# Patient Record
Sex: Female | Born: 1940 | Race: White | Hispanic: No | Smoking: Never smoker
Health system: Southern US, Community
[De-identification: ages and names within clinical notes are randomized; demographics above are authoritative.]

## PROBLEM LIST (undated history)

## (undated) ENCOUNTER — Ambulatory Visit: Admission: EM | Payer: Medicare Other

## (undated) DIAGNOSIS — H409 Unspecified glaucoma: Secondary | ICD-10-CM

## (undated) HISTORY — DX: Unspecified glaucoma: H40.9

---

## 1999-04-22 ENCOUNTER — Encounter: Payer: Self-pay | Admitting: Obstetrics and Gynecology

## 1999-04-22 ENCOUNTER — Encounter: Admission: RE | Admit: 1999-04-22 | Discharge: 1999-04-22 | Payer: Self-pay | Admitting: Obstetrics and Gynecology

## 1999-09-22 ENCOUNTER — Encounter: Payer: Self-pay | Admitting: Obstetrics and Gynecology

## 1999-09-22 ENCOUNTER — Encounter: Admission: RE | Admit: 1999-09-22 | Discharge: 1999-09-22 | Payer: Self-pay | Admitting: Obstetrics and Gynecology

## 2000-10-12 ENCOUNTER — Encounter: Admission: RE | Admit: 2000-10-12 | Discharge: 2000-10-12 | Payer: Self-pay | Admitting: Obstetrics and Gynecology

## 2000-10-12 ENCOUNTER — Encounter: Payer: Self-pay | Admitting: Obstetrics and Gynecology

## 2001-03-08 ENCOUNTER — Encounter: Admission: RE | Admit: 2001-03-08 | Discharge: 2001-03-08 | Payer: Self-pay | Admitting: Endocrinology

## 2001-03-08 ENCOUNTER — Encounter: Payer: Self-pay | Admitting: Endocrinology

## 2001-10-29 ENCOUNTER — Encounter: Admission: RE | Admit: 2001-10-29 | Discharge: 2001-10-29 | Payer: Self-pay | Admitting: Obstetrics and Gynecology

## 2001-10-29 ENCOUNTER — Encounter: Payer: Self-pay | Admitting: Obstetrics and Gynecology

## 2002-01-15 ENCOUNTER — Encounter: Admission: RE | Admit: 2002-01-15 | Discharge: 2002-01-15 | Payer: Self-pay | Admitting: Orthopedic Surgery

## 2002-01-15 ENCOUNTER — Encounter: Payer: Self-pay | Admitting: Orthopedic Surgery

## 2002-11-07 ENCOUNTER — Encounter: Admission: RE | Admit: 2002-11-07 | Discharge: 2002-11-07 | Payer: Self-pay | Admitting: Obstetrics and Gynecology

## 2002-11-07 ENCOUNTER — Encounter: Payer: Self-pay | Admitting: Obstetrics and Gynecology

## 2003-12-01 ENCOUNTER — Encounter: Admission: RE | Admit: 2003-12-01 | Discharge: 2003-12-01 | Payer: Self-pay | Admitting: Obstetrics and Gynecology

## 2005-03-03 ENCOUNTER — Encounter: Admission: RE | Admit: 2005-03-03 | Discharge: 2005-03-03 | Payer: Self-pay | Admitting: Endocrinology

## 2006-03-21 ENCOUNTER — Encounter: Admission: RE | Admit: 2006-03-21 | Discharge: 2006-03-21 | Payer: Self-pay | Admitting: Endocrinology

## 2006-05-07 ENCOUNTER — Ambulatory Visit (HOSPITAL_COMMUNITY): Admission: RE | Admit: 2006-05-07 | Discharge: 2006-05-07 | Payer: Self-pay | Admitting: Endocrinology

## 2006-05-07 ENCOUNTER — Ambulatory Visit: Payer: Self-pay | Admitting: *Deleted

## 2007-04-05 ENCOUNTER — Encounter: Admission: RE | Admit: 2007-04-05 | Discharge: 2007-04-05 | Payer: Self-pay | Admitting: Endocrinology

## 2007-04-14 ENCOUNTER — Observation Stay (HOSPITAL_COMMUNITY): Admission: EM | Admit: 2007-04-14 | Discharge: 2007-04-14 | Payer: Self-pay | Admitting: *Deleted

## 2008-04-06 ENCOUNTER — Encounter: Admission: RE | Admit: 2008-04-06 | Discharge: 2008-04-06 | Payer: Self-pay | Admitting: Obstetrics and Gynecology

## 2009-04-22 ENCOUNTER — Encounter: Admission: RE | Admit: 2009-04-22 | Discharge: 2009-04-22 | Payer: Self-pay | Admitting: Obstetrics and Gynecology

## 2010-04-25 ENCOUNTER — Encounter
Admission: RE | Admit: 2010-04-25 | Discharge: 2010-04-25 | Payer: Self-pay | Source: Home / Self Care | Attending: Obstetrics and Gynecology | Admitting: Obstetrics and Gynecology

## 2010-12-16 LAB — BASIC METABOLIC PANEL
BUN: 11
Calcium: 9.3
GFR calc Af Amer: >60
GFR calc non Af Amer: >60
Glucose, Bld: 137 — ABNORMAL HIGH
Potassium: 4.3
Sodium: 140

## 2010-12-16 LAB — HEMOGLOBIN AND HEMATOCRIT, BLOOD
HCT: 36.9
Hemoglobin: 12.7

## 2011-04-04 ENCOUNTER — Other Ambulatory Visit: Payer: Self-pay | Admitting: Internal Medicine

## 2011-04-04 DIAGNOSIS — Z1231 Encounter for screening mammogram for malignant neoplasm of breast: Secondary | ICD-10-CM

## 2011-05-05 ENCOUNTER — Ambulatory Visit
Admission: RE | Admit: 2011-05-05 | Discharge: 2011-05-05 | Disposition: A | Discharge status: Home / Self Care | Payer: Medicare Other | Source: Ambulatory Visit | Attending: Internal Medicine | Admitting: Internal Medicine

## 2011-05-05 DIAGNOSIS — Z1231 Encounter for screening mammogram for malignant neoplasm of breast: Secondary | ICD-10-CM

## 2012-05-07 ENCOUNTER — Other Ambulatory Visit: Payer: Self-pay | Admitting: Internal Medicine

## 2012-05-07 DIAGNOSIS — Z1231 Encounter for screening mammogram for malignant neoplasm of breast: Secondary | ICD-10-CM

## 2012-05-28 ENCOUNTER — Ambulatory Visit
Admission: RE | Admit: 2012-05-28 | Discharge: 2012-05-28 | Disposition: A | Discharge status: Home / Self Care | Payer: Medicare Other | Source: Ambulatory Visit | Attending: Internal Medicine | Admitting: Internal Medicine

## 2012-05-28 DIAGNOSIS — Z1231 Encounter for screening mammogram for malignant neoplasm of breast: Secondary | ICD-10-CM

## 2013-05-30 ENCOUNTER — Other Ambulatory Visit: Payer: Self-pay

## 2013-05-30 DIAGNOSIS — Z1231 Encounter for screening mammogram for malignant neoplasm of breast: Secondary | ICD-10-CM

## 2013-06-17 ENCOUNTER — Ambulatory Visit
Admission: RE | Admit: 2013-06-17 | Discharge: 2013-06-17 | Disposition: A | Discharge status: Home / Self Care | Payer: Medicare Other | Source: Ambulatory Visit

## 2013-06-17 DIAGNOSIS — Z1231 Encounter for screening mammogram for malignant neoplasm of breast: Secondary | ICD-10-CM

## 2014-06-16 ENCOUNTER — Other Ambulatory Visit: Payer: Self-pay

## 2014-06-16 DIAGNOSIS — Z1231 Encounter for screening mammogram for malignant neoplasm of breast: Secondary | ICD-10-CM

## 2014-07-14 ENCOUNTER — Ambulatory Visit
Admission: RE | Admit: 2014-07-14 | Discharge: 2014-07-14 | Disposition: A | Discharge status: Home / Self Care | Payer: Medicare Other | Source: Ambulatory Visit

## 2014-07-14 DIAGNOSIS — Z1231 Encounter for screening mammogram for malignant neoplasm of breast: Secondary | ICD-10-CM

## 2015-06-22 ENCOUNTER — Other Ambulatory Visit: Payer: Self-pay

## 2015-06-22 DIAGNOSIS — Z1231 Encounter for screening mammogram for malignant neoplasm of breast: Secondary | ICD-10-CM

## 2015-07-13 ENCOUNTER — Ambulatory Visit: Payer: Medicare Other

## 2015-07-15 ENCOUNTER — Ambulatory Visit: Payer: Medicare Other

## 2015-07-20 ENCOUNTER — Ambulatory Visit
Admission: RE | Admit: 2015-07-20 | Discharge: 2015-07-20 | Disposition: A | Discharge status: Home / Self Care | Payer: Medicare Other | Source: Ambulatory Visit

## 2015-07-20 DIAGNOSIS — Z1231 Encounter for screening mammogram for malignant neoplasm of breast: Secondary | ICD-10-CM

## 2016-06-22 ENCOUNTER — Other Ambulatory Visit: Payer: Self-pay | Admitting: Internal Medicine

## 2016-06-22 DIAGNOSIS — Z1231 Encounter for screening mammogram for malignant neoplasm of breast: Secondary | ICD-10-CM

## 2016-08-14 ENCOUNTER — Other Ambulatory Visit: Payer: Self-pay | Admitting: Internal Medicine

## 2016-08-14 ENCOUNTER — Ambulatory Visit
Admission: RE | Admit: 2016-08-14 | Discharge: 2016-08-14 | Disposition: A | Discharge status: Home / Self Care | Payer: Medicare Other | Source: Ambulatory Visit | Attending: Internal Medicine | Admitting: Internal Medicine

## 2016-08-14 DIAGNOSIS — Z1231 Encounter for screening mammogram for malignant neoplasm of breast: Secondary | ICD-10-CM

## 2016-08-25 ENCOUNTER — Other Ambulatory Visit: Payer: Self-pay | Admitting: Internal Medicine

## 2017-07-23 ENCOUNTER — Other Ambulatory Visit: Payer: Self-pay | Admitting: Internal Medicine

## 2017-07-23 DIAGNOSIS — Z1231 Encounter for screening mammogram for malignant neoplasm of breast: Secondary | ICD-10-CM

## 2017-07-27 ENCOUNTER — Other Ambulatory Visit: Payer: Self-pay | Admitting: Internal Medicine

## 2017-07-27 DIAGNOSIS — M5416 Radiculopathy, lumbar region: Secondary | ICD-10-CM

## 2017-08-10 ENCOUNTER — Ambulatory Visit
Admission: RE | Admit: 2017-08-10 | Discharge: 2017-08-10 | Disposition: A | Discharge status: Home / Self Care | Payer: Medicare Other | Source: Ambulatory Visit | Attending: Internal Medicine | Admitting: Internal Medicine

## 2017-08-10 DIAGNOSIS — M5416 Radiculopathy, lumbar region: Secondary | ICD-10-CM

## 2017-08-31 ENCOUNTER — Ambulatory Visit
Admission: RE | Admit: 2017-08-31 | Discharge: 2017-08-31 | Disposition: A | Discharge status: Home / Self Care | Payer: Medicare Other | Source: Ambulatory Visit | Attending: Internal Medicine | Admitting: Internal Medicine

## 2017-08-31 DIAGNOSIS — Z1231 Encounter for screening mammogram for malignant neoplasm of breast: Secondary | ICD-10-CM

## 2018-08-02 ENCOUNTER — Other Ambulatory Visit: Payer: Self-pay | Admitting: Internal Medicine

## 2018-08-02 DIAGNOSIS — Z1231 Encounter for screening mammogram for malignant neoplasm of breast: Secondary | ICD-10-CM

## 2018-10-15 ENCOUNTER — Other Ambulatory Visit: Payer: Self-pay

## 2018-10-15 ENCOUNTER — Ambulatory Visit
Admission: RE | Admit: 2018-10-15 | Discharge: 2018-10-15 | Disposition: A | Discharge status: Home / Self Care | Payer: Medicare Other | Source: Ambulatory Visit | Attending: Internal Medicine | Admitting: Internal Medicine

## 2018-10-15 DIAGNOSIS — Z1231 Encounter for screening mammogram for malignant neoplasm of breast: Secondary | ICD-10-CM

## 2019-02-25 DIAGNOSIS — H547 Unspecified visual loss: Secondary | ICD-10-CM

## 2019-02-25 HISTORY — DX: Unspecified visual loss: H54.7

## 2019-08-24 LAB — COLOGUARD: COLOGUARD: NEGATIVE

## 2019-09-12 ENCOUNTER — Other Ambulatory Visit: Payer: Self-pay | Admitting: Internal Medicine

## 2019-09-12 DIAGNOSIS — Z1231 Encounter for screening mammogram for malignant neoplasm of breast: Secondary | ICD-10-CM

## 2019-10-29 ENCOUNTER — Ambulatory Visit
Admission: RE | Admit: 2019-10-29 | Discharge: 2019-10-29 | Disposition: A | Discharge status: Home / Self Care | Payer: Medicare Other | Source: Ambulatory Visit | Attending: Internal Medicine | Admitting: Internal Medicine

## 2019-10-29 ENCOUNTER — Other Ambulatory Visit: Payer: Self-pay

## 2019-10-29 DIAGNOSIS — Z1231 Encounter for screening mammogram for malignant neoplasm of breast: Secondary | ICD-10-CM

## 2020-09-16 ENCOUNTER — Other Ambulatory Visit: Payer: Self-pay | Admitting: Internal Medicine

## 2020-09-16 DIAGNOSIS — N6320 Unspecified lump in the left breast, unspecified quadrant: Secondary | ICD-10-CM

## 2021-01-31 ENCOUNTER — Ambulatory Visit
Admission: RE | Admit: 2021-01-31 | Discharge: 2021-01-31 | Disposition: A | Discharge status: Home / Self Care | Payer: Medicare Other | Source: Ambulatory Visit | Attending: Internal Medicine | Admitting: Internal Medicine

## 2021-01-31 ENCOUNTER — Ambulatory Visit: Payer: Medicare Other

## 2021-01-31 ENCOUNTER — Other Ambulatory Visit: Payer: Self-pay

## 2021-01-31 DIAGNOSIS — N6320 Unspecified lump in the left breast, unspecified quadrant: Secondary | ICD-10-CM

## 2021-02-11 ENCOUNTER — Ambulatory Visit
Admission: RE | Admit: 2021-02-11 | Discharge: 2021-02-11 | Disposition: A | Discharge status: Home / Self Care | Payer: Medicare Other | Source: Ambulatory Visit | Attending: Internal Medicine | Admitting: Internal Medicine

## 2021-02-11 ENCOUNTER — Other Ambulatory Visit: Payer: Self-pay

## 2021-02-11 DIAGNOSIS — N6320 Unspecified lump in the left breast, unspecified quadrant: Secondary | ICD-10-CM

## 2021-03-30 ENCOUNTER — Other Ambulatory Visit: Payer: Self-pay | Admitting: Internal Medicine

## 2021-03-30 DIAGNOSIS — R928 Other abnormal and inconclusive findings on diagnostic imaging of breast: Secondary | ICD-10-CM

## 2021-03-30 DIAGNOSIS — N649 Disorder of breast, unspecified: Secondary | ICD-10-CM

## 2021-03-30 DIAGNOSIS — R922 Inconclusive mammogram: Secondary | ICD-10-CM

## 2021-04-06 ENCOUNTER — Other Ambulatory Visit: Payer: Medicare Other

## 2021-04-07 ENCOUNTER — Other Ambulatory Visit: Payer: Self-pay

## 2021-04-07 ENCOUNTER — Encounter: Payer: Self-pay | Admitting: Dermatology

## 2021-04-07 ENCOUNTER — Ambulatory Visit (INDEPENDENT_AMBULATORY_CARE_PROVIDER_SITE_OTHER): Payer: Medicare Other | Admitting: Dermatology

## 2021-04-07 DIAGNOSIS — L858 Other specified epidermal thickening: Secondary | ICD-10-CM | POA: Diagnosis not present

## 2021-04-14 ENCOUNTER — Other Ambulatory Visit: Payer: Medicare Other

## 2021-04-21 ENCOUNTER — Ambulatory Visit: Payer: Medicare Other | Admitting: Dermatology

## 2021-04-28 ENCOUNTER — Ambulatory Visit (INDEPENDENT_AMBULATORY_CARE_PROVIDER_SITE_OTHER): Payer: Medicare Other | Admitting: Dermatology

## 2021-04-28 ENCOUNTER — Encounter: Payer: Self-pay | Admitting: Dermatology

## 2021-04-28 ENCOUNTER — Other Ambulatory Visit: Payer: Self-pay

## 2021-04-28 DIAGNOSIS — B359 Dermatophytosis, unspecified: Secondary | ICD-10-CM

## 2021-04-28 DIAGNOSIS — L821 Other seborrheic keratosis: Secondary | ICD-10-CM | POA: Diagnosis not present

## 2021-04-28 DIAGNOSIS — C44719 Basal cell carcinoma of skin of left lower limb, including hip: Secondary | ICD-10-CM

## 2021-04-28 DIAGNOSIS — C44722 Squamous cell carcinoma of skin of right lower limb, including hip: Secondary | ICD-10-CM | POA: Diagnosis not present

## 2021-04-28 DIAGNOSIS — D485 Neoplasm of uncertain behavior of skin: Secondary | ICD-10-CM

## 2021-04-28 DIAGNOSIS — R21 Rash and other nonspecific skin eruption: Secondary | ICD-10-CM

## 2021-04-28 LAB — POCT SKIN KOH

## 2021-04-28 MED ORDER — MUPIROCIN 2 % EX OINT: TOPICAL_OINTMENT | CUTANEOUS | 2 refills | Status: AC

## 2021-04-28 NOTE — Patient Instructions (Signed)

## 2021-04-30 ENCOUNTER — Encounter: Payer: Self-pay | Admitting: Dermatology

## 2021-04-30 NOTE — Progress Notes (Signed)
° °  Follow-Up Visit   Subjective  April Klein is a 81 y.o. female who presents for the following: Skin Problem (Pt got scratched by her dog on the R lower leg about 8wks ago and she was self treating. Soon after she started 10 days of doxy (mg unknown) and using mupiricin oint. Debridement was done at [place] and she was told to have it evaluated for skin cancer /Pt also has a scab on the Left Posterior Leg that has been present for about 6 months. Scabs up a lot a lot never heals ).  Nonhealing spots on legs Location:  Duration:  Quality:  Associated Signs/Symptoms: Modifying Factors:  Severity:  Timing: Context:   Objective  Well appearing patient in no apparent distress; mood and affect are within normal limits. Left Lower Leg - Posterior, Right Lower Leg - Anterior Although the right shin lesion may have been triggered by minor trauma, this heaped up volcano shaped nodule best fits a squamous cell carcinoma, perhaps keratoacanthoma variant.  The left calf lesion may be more superficial squamous cell carcinoma.             A focused examination was performed including arms, legs. Relevant physical exam findings are noted in the Assessment and Plan.   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  Will return after her trip to [place].      I, Lavonna Monarch, MD, have reviewed all documentation for this visit.  The documentation on 04/30/21 for the exam, diagnosis, procedures, and orders are all accurate and complete.

## 2021-05-03 ENCOUNTER — Telehealth: Payer: Self-pay | Admitting: Dermatology

## 2021-05-03 NOTE — Telephone Encounter (Signed)
Path to patient and mohs surgery information sent. Patient is aware of the 3 week time frame for the call back for mohs.

## 2021-05-03 NOTE — Telephone Encounter (Signed)
Patient calling for results of Bx's. I informed patient that we are waiting for provider to review results.

## 2021-05-12 ENCOUNTER — Other Ambulatory Visit: Payer: Medicare Other

## 2021-05-21 ENCOUNTER — Encounter: Payer: Self-pay | Admitting: Dermatology

## 2021-05-21 NOTE — Progress Notes (Signed)
Left  Follow-Up Visit   Subjective  April Klein is a 81 y.o. female who presents for the following: Follow-up (Patient here today for biopsies on right lower leg and left shin. Per patient she has a lesion on her right hip x 2 years no bleeding, no pain per patient it looks like the lesion on her right lower leg. Per patient she also has a scaly lesion on her right areola x few weeks no treatment. ).  Returns for biopsy of her legs but also has growing spot on right lower leg and would like lesions on hip and breast check. Location:  Duration:  Quality:  Associated Signs/Symptoms: Modifying Factors:  Severity:  Timing: Context:   Objective  Well appearing patient in no apparent distress; mood and affect are within normal limits. Right Hip Textured brown flattopped papules also has on right areola.  Typical dermoscopy  Right Lower Leg - Anterior Pearly 8 mm crust suspicious for BCC       Left Lower Leg - Anterior 1.2 cm volcano like crust, keratoacanthoma type SCCA.  This will almost surely best be treated with Mohs surgery.       Right Breast KOH - POSITIVE    A focused examination was performed including legs, hip, right breast.. Relevant physical exam findings are noted in the Assessment and Plan.   Assessment & Plan    Seborrheic keratosis Right Hip  No intervention currently necessary  Neoplasm of uncertain behavior of skin (2) Right Lower Leg - Anterior  Skin / nail biopsy Type of biopsy: tangential   Informed consent: discussed and consent obtained   Timeout: patient name, date of birth, surgical site, and procedure verified   Anesthesia: the lesion was anesthetized in a standard fashion   Anesthetic:  1% lidocaine w/ epinephrine 1-100,000 local infiltration Instrument used: flexible razor blade   Hemostasis achieved with: ferric subsulfate and electrodesiccation   Outcome: patient tolerated procedure well   Post-procedure details: wound care  instructions given    Specimen 1 - Surgical pathology Differential Diagnosis: R/O BCC VS SCC  Check Margins: No  Left Lower Leg - Anterior  Skin / nail biopsy Type of biopsy: tangential   Informed consent: discussed and consent obtained   Timeout: patient name, date of birth, surgical site, and procedure verified   Anesthesia: the lesion was anesthetized in a standard fashion   Anesthetic:  1% lidocaine w/ epinephrine 1-100,000 local infiltration Instrument used: flexible razor blade   Hemostasis achieved with: ferric subsulfate and electrodesiccation   Outcome: patient tolerated procedure well   Post-procedure details: wound care instructions given    Specimen 2 - Surgical pathology Differential Diagnosis: R/O BCC VS SCC   Check Margins: No  Rash and other nonspecific skin eruption Right Breast  Related Procedures POCT Skin KOH Culture, fungus without smear  Related Medications mupirocin ointment (BACTROBAN) 2 % APPLY TO AFFECTED AREA 3 TIMES A DAY  Tinea  Related Procedures Culture, fungus without smear      I, Lavonna Monarch, MD, have reviewed all documentation for this visit.  The documentation on 05/21/21 for the exam, diagnosis, procedures, and orders are all accurate and complete.

## 2021-05-28 LAB — CULTURE, FUNGUS WITHOUT SMEAR
MICRO NUMBER:: 12954939
SPECIMEN QUALITY:: ADEQUATE

## 2022-05-17 ENCOUNTER — Other Ambulatory Visit: Payer: Self-pay | Admitting: Radiology

## 2022-05-19 ENCOUNTER — Encounter: Payer: Self-pay | Admitting: *Deleted

## 2022-05-23 ENCOUNTER — Other Ambulatory Visit: Payer: Self-pay

## 2022-05-23 ENCOUNTER — Telehealth: Payer: Self-pay | Admitting: Hematology and Oncology

## 2022-05-23 ENCOUNTER — Inpatient Hospital Stay: Payer: Medicare Other | Attending: Hematology and Oncology | Admitting: Hematology and Oncology

## 2022-05-23 VITALS — BP 171/80 | HR 60 | Temp 97.6°F | Resp 18 | Wt 201.8 lb

## 2022-05-23 DIAGNOSIS — Z170 Estrogen receptor positive status [ER+]: Secondary | ICD-10-CM | POA: Diagnosis not present

## 2022-05-23 DIAGNOSIS — C50412 Malignant neoplasm of upper-outer quadrant of left female breast: Secondary | ICD-10-CM

## 2022-05-23 MED ORDER — ANASTROZOLE 1 MG PO TABS: 1.0000 mg | ORAL_TABLET | Freq: Every day | ORAL | 3 refills | Status: DC

## 2022-05-23 NOTE — Telephone Encounter (Signed)
Scheduled appt per 2/27 staff msg. Pt is aware of appt date  and time. Pt is aware to arrive 15 mins prior to appt time and to bring and updated insurance card. Pt is aware of appt location.

## 2022-05-23 NOTE — Assessment & Plan Note (Addendum)
05/17/2022:Mammogram and ultrasound detected left breast cancer 8 cm size biopsy revealed grade 2 ILC with LCIS, lymph node positive for cancer, ER 100%, PR 100%, Ki67 20%, HER2 1+  Pathology and radiology counseling:Discussed with the patient, the details of pathology including the type of breast cancer,the clinical staging, the significance of ER, PR and HER-2/neu receptors and the implications for treatment. After reviewing the pathology in detail, we proceeded to discuss the different treatment options between surgery, radiation, chemotherapy, antiestrogen therapies.  Recommendations: 1. Breast conserving surgery followed by 2. Adjuvant radiation therapy followed by 3. Adjuvant antiestrogen therapy  Given the lobular nature of her breast cancer and her age we decided that chemotherapy would not be in her best interest.  She is in complete agreement and wants [place] to focus on her quality of life.   Return to clinic after surgery to discuss final pathology report

## 2022-05-23 NOTE — Progress Notes (Signed)
[facility] CONSULT NOTE  Patient Care Team: Prince Solian, MD as PCP - General (Internal Medicine)  CHIEF COMPLAINTS/PURPOSE OF CONSULTATION:  Newly diagnosed breast cancer  HISTORY OF PRESENTING ILLNESS:  April Klein 82 y.o. female is here because of recent diagnosis of left breast cancer.  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  Most recent mammogram suggested an 8 cm area of abnormality that was concerning for breast cancer and so she underwent a biopsy.  There was also a left axillary lymph node that was biopsied positive for breast cancer.  She wanted to see [place] for initial consultation and then see Dr. Donne Hazel.  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  I reviewed her records extensively and collaborated the history with the patient.  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  SOCIAL HISTORY: Social History   Socioeconomic History   Marital status: Widowed    Spouse name: Not on file   Number of children: Not on file   Years of education: Not on file   Highest education level: Not on file  Occupational History   Not on file   Tobacco Use   Smoking status: Never   Smokeless tobacco: Never  Substance and Sexual Activity   Alcohol use: Not on file   Drug use: Not on file   Sexual activity: Not on file  Other Topics Concern   Not on file  Social History Narrative   Not on file   Social Determinants of Health   Financial Resource Strain: Not on file  Food Insecurity: Not on file  Transportation Needs: Not on file  Physical Activity: Not on file  Stress: Not on file  Social Connections: Not on file  Intimate Partner Violence: Not on file    FAMILY HISTORY: No family history on file.  ALLERGIES:  is allergic to cortisone, povidone-iodine, and statins.  MEDICATIONS:  Current Outpatient Medications  Medication Sig Dispense Refill   anastrozole (ARIMIDEX) 1 MG tablet Take 1 tablet (1 mg total) by mouth daily. 90 tablet 3   B Complex Vitamins (VITAMIN B COMPLEX PO) Take 1 capsule by mouth daily.     Cholecalciferol 50 MCG (2000 UT) CAPS Take by mouth.     Cranberry 400 MG CAPS cranberry 400 mg capsule  Take 1 capsule every day by oral route.     ezetimibe (ZETIA) 10 MG tablet Take 10 mg by mouth daily.     levothyroxine (SYNTHROID) 50 MCG tablet Take 50 mcg by mouth every morning.     Lutein 20 MG TABS lutein 20 mg tablet  Take 1 tablet every day by oral route.  meloxicam (MOBIC) 15 MG tablet meloxicam 15 mg tablet     meloxicam (MOBIC) 7.5 MG tablet meloxicam 7.5 mg tablet     Multiple Vitamins-Minerals (PRESERVISION AREDS 2+MULTI VIT PO) PreserVision AREDS  take 2 tabs daily     mupirocin ointment (BACTROBAN) 2 % APPLY TO AFFECTED AREA 3 TIMES A DAY 22 g 2   No current facility-administered medications for this visit.    REVIEW OF SYSTEMS:   Constitutional: Denies fevers, chills or abnormal night sweats Breast:  Denies any palpable lumps or discharge All other systems were reviewed with the patient and are negative.  PHYSICAL EXAMINATION: ECOG PERFORMANCE STATUS: 1 - Symptomatic but  completely ambulatory  Vitals:   05/23/22 1533  BP: (!) 171/80  Pulse: 60  Resp: 18  Temp: 97.6 F (36.4 C)  SpO2: 98%   Filed Weights   05/23/22 1533  Weight: 201 lb 12.8 oz (91.5 kg)    GENERAL:alert, no distress and comfortable    LABORATORY DATA:  I have reviewed the data as listed Lab Results  Component Value Date   HGB 12.7 04/14/2007   HCT 36.9 04/14/2007   Lab Results  Component Value Date   NA 140 04/14/2007   K 4.3 04/14/2007   CL 105 04/14/2007   CO2 24 04/14/2007    RADIOGRAPHIC STUDIES: I have personally reviewed the radiological reports and agreed with the findings in the report.  ASSESSMENT AND PLAN:  Malignant neoplasm of upper-outer quadrant of left breast in female, estrogen receptor positive ([facility]) 05/17/2022:Mammogram and ultrasound detected left breast cancer 8 cm size biopsy revealed grade 2 ILC with LCIS, lymph node positive for cancer, ER 100%, PR 100%, Ki67 20%, HER2 1+  Pathology and radiology counseling:Discussed with the patient, the details of pathology including the type of breast cancer,the clinical staging, the significance of ER, PR and HER-2/neu receptors and the implications for treatment. After reviewing the pathology in detail, we proceeded to discuss the different treatment options between surgery, radiation, chemotherapy, antiestrogen therapies.  Recommendations: 1. Breast conserving surgery followed by 2. Adjuvant radiation therapy followed by 3. Adjuvant antiestrogen therapy  Given the lobular nature of her breast cancer and her age we decided that chemotherapy would not be in her best interest.  She is in complete agreement and wants [place] to focus on her quality of life.   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 Return to clinic after surgery to discuss final pathology report     All questions were answered. The patient knows to  call the clinic with any problems, questions or concerns.    Harriette Ohara, MD 05/23/22

## 2022-05-24 ENCOUNTER — Encounter: Payer: Self-pay | Admitting: Internal Medicine

## 2022-05-30 ENCOUNTER — Inpatient Hospital Stay: Payer: Medicare Other | Attending: Hematology and Oncology | Admitting: Licensed Clinical Social Worker

## 2022-05-30 NOTE — Progress Notes (Signed)
[place] Work  Initial Assessment   DAZJA ONSTAD is a 82 y.o. year old female contacted by phone. Clinical Social Work was referred by new patient protocol for assessment of psychosocial needs.   SDOH (Social Determinants of Health) assessments performed: Yes SDOH Interventions    Flowsheet Row Clinical Support from 05/30/2022 in [place] at [facility]  SDOH Interventions   Food Insecurity Interventions Intervention Not Indicated  Housing Interventions Intervention Not Indicated  Financial Strain Interventions Intervention Not Indicated       SDOH Screenings   Food Insecurity: No Food Insecurity (05/30/2022)  Housing: Low Risk  (05/30/2022)  Financial Resource Strain: Low Risk  (05/30/2022)  Tobacco Use: Low Risk  (05/21/2021)     Distress Screen completed: No     No data to display            Family/Social Information:  Housing Arrangement: patient lives alone Family members/support persons in your life? 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  Income source: Employment and Paediatric nurse concerns: No Type of concern: None Food access concerns: no Religious or spiritual practice: Not known Services Currently in place:    Coping/ Adjustment to diagnosis: Patient understands treatment plan and what happens next? yes, delayed starting anastrozole because of emergency eye surgery- waiting for clearance. Meeting with Dr. Donne Hazel next week Concerns about diagnosis and/or treatment: I'm not especially worried about anything, just figuring out schedule for work Patient reported stressors:  recent eye surgery and still recovering sight Current coping skills/ strengths: Ability for insight , Capable of independent living , Communication skills , and Supportive family/friends      SUMMARY: Current SDOH Barriers:  No major barriers noted today  Clinical Social Work Clinical Goal(s):  No clinical social work goals at this time  Interventions: Discussed common feeling and emotions when being diagnosed with cancer, and the importance of support during treatment Informed patient of the support team roles and support services at [facility] Encouraged patient to call with any questions or concerns   Follow Up Plan: Patient will contact CSW with any support or resource needs Patient verbalizes understanding of plan: Yes    Zamya Culhane E Evanna Washinton, LCSW

## 2022-06-09 ENCOUNTER — Encounter: Payer: Self-pay | Admitting: *Deleted

## 2022-06-22 ENCOUNTER — Other Ambulatory Visit: Payer: Self-pay | Admitting: *Deleted

## 2022-06-22 DIAGNOSIS — Z170 Estrogen receptor positive status [ER+]: Secondary | ICD-10-CM

## 2022-06-23 ENCOUNTER — Telehealth: Payer: Self-pay | Admitting: Hematology and Oncology

## 2022-06-23 NOTE — Telephone Encounter (Signed)
Per 3/27 IB reached out to patient to schedule, patient aware of date and time of appointment. 

## 2022-07-04 ENCOUNTER — Inpatient Hospital Stay: Payer: Medicare Other | Admitting: Genetic Counselor

## 2022-07-07 ENCOUNTER — Encounter: Payer: Self-pay | Admitting: *Deleted

## 2022-08-07 ENCOUNTER — Inpatient Hospital Stay: Payer: Medicare Other

## 2022-08-07 ENCOUNTER — Inpatient Hospital Stay: Payer: Medicare Other | Attending: Hematology and Oncology | Admitting: Genetic Counselor

## 2022-08-07 ENCOUNTER — Other Ambulatory Visit: Payer: Self-pay | Admitting: Genetic Counselor

## 2022-08-07 ENCOUNTER — Encounter: Payer: Self-pay | Admitting: Genetic Counselor

## 2022-08-07 ENCOUNTER — Other Ambulatory Visit: Payer: Self-pay

## 2022-08-07 DIAGNOSIS — C50412 Malignant neoplasm of upper-outer quadrant of left female breast: Secondary | ICD-10-CM | POA: Diagnosis not present

## 2022-08-07 DIAGNOSIS — Z170 Estrogen receptor positive status [ER+]: Secondary | ICD-10-CM

## 2022-08-07 DIAGNOSIS — Z800 Family history of malignant neoplasm of digestive organs: Secondary | ICD-10-CM

## 2022-08-07 LAB — GENETIC SCREENING ORDER

## 2022-08-07 NOTE — Progress Notes (Signed)
REFERRING PROVIDER: Serena Croissant, MD [address] [place],  [place] [address]  PRIMARY PROVIDER:  Chilton Greathouse, MD  PRIMARY REASON FOR VISIT:  1. Malignant neoplasm of upper-outer quadrant of left breast in female, estrogen receptor positive (HCC)   2. Family history of pancreatic cancer     HISTORY OF PRESENT ILLNESS:   Ms. Housey, a 82 y.o. female, was seen for a [facility] cancer genetics consultation at the request of Dr. Pamelia Hoit due to a personal history of breast cancer.  Ms. Vasseur presents to clinic today to discuss the possibility of a hereditary predisposition to cancer, to discuss genetic testing, and to further clarify her future cancer risks, as well as potential cancer risks for family members.   In February 2024, at the age of 70, Ms. 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  CANCER HISTORY:  Oncology History  Malignant neoplasm of upper-outer quadrant of left breast in female, estrogen receptor positive (HCC)  05/17/2022 Initial Diagnosis   Mammogram and ultrasound detected left breast cancer 8 cm size biopsy revealed grade 2 ILC with LCIS, lymph node positive for cancer, ER 100%, PR 100%, Ki67 20%, HER2 1+   05/23/2022 Cancer Staging   Staging form: Breast, AJCC 8th Edition - Clinical: Stage IIA (cT3, cN1, cM0, G2, ER+, PR+, HER2-) - Signed by Serena Croissant, MD on 05/23/2022 Histologic grading system: 3 grade system       Past Medical History:  Diagnosis Date   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  FAMILY HISTORY:  We obtained a detailed, 4-generation family history.  Significant diagnoses are listed below: Family History  Problem Relation Age of Onset   Pancreatic cancer Father 10     Ms. Lundstrom is unaware of previous family history of genetic testing for hereditary cancer risks.  There is no reported  Ashkenazi Jewish ancestry. There is no known consanguinity.  GENETIC COUNSELING ASSESSMENT: Ms. Eade is a 82 y.o. female with a personal and family history of cancer which is somewhat suggestive of a hereditary cancer syndrome and predisposition to cancer given the presence of related cancers in the family (breast, pancreatic). We, therefore, discussed and recommended the following at today's visit.   DISCUSSION: We discussed that 5 - 10% of cancer is hereditary.  Most cases of hereditary breast and pancreatic cancer are associated with mutations in BRCA1/2.  There are other genes that can be associated with hereditary breast or pancreatic cancer syndromes.  We discussed that testing is beneficial for several reasons including knowing how to follow individuals for their cancer risks and understanding if other family members could be at risk for cancer and allowing them to undergo genetic testing.   We reviewed the characteristics, features and inheritance patterns of hereditary cancer syndromes. We also discussed genetic testing, including the appropriate family members to test, the process of testing, insurance coverage and turn-around-time for results. We discussed the implications of a negative, positive, carrier and/or variant of uncertain significant result. We recommended Ms. Delafuente pursue genetic testing for a panel that includes genes associated with breast, pancreatic, and other cancers.   Ms. Sponaugle  was offered a common hereditary cancer panel (48 genes) and an expanded pan-cancer panel (70 genes). Ms. Sokolski was informed of the benefits and limitations of each panel, including that expanded pan-cancer panels contain genes that do not have  clear management guidelines at this point in time.  We also discussed that as the number of genes included on a panel increases, the chances of variants of uncertain significance increases.  After considering the benefits and limitations of each gene panel,  Ms. Idleman  elected to have an expanded Psychologist, occupational through [employer].  The Multi-Cancer + RNA Panel offered by [facility] includes sequencing and/or deletion/duplication analysis of the following 70 genes:  AIP*, ALK, APC*, ATM*, AXIN2*, BAP1*, BARD1*, BLM*, BMPR1A*, BRCA1*, BRCA2*, BRIP1*, CDC73*, CDH1*, CDK4, CDKN1B*, CDKN2A, CHEK2*, CTNNA1*, DICER1*, EPCAM (del/dup only), EGFR, FH*, FLCN*, GREM1 (promoter dup only), HOXB13, KIT, LZTR1, MAX*, MBD4, MEN1*, MET, MITF, MLH1*, MSH2*, MSH3*, MSH6*, MUTYH*, NF1*, NF2*, NTHL1*, PALB2*, PDGFRA, PMS2*, POLD1*, POLE*, POT1*, PRKAR1A*, PTCH1*, PTEN*, RAD51C*, RAD51D*, RB1*, RET, SDHA* (sequencing only), SDHAF2*, SDHB*, SDHC*, SDHD*, SMAD4*, SMARCA4*, SMARCB1*, SMARCE1*, STK11*, SUFU*, TMEM127*, TP53*, TSC1*, TSC2*, VHL*. RNA analysis is performed for * genes.  Based on Ms. Vallone's personal and family history of cancer, she meets medical criteria for genetic testing.   PLAN: After considering the risks, benefits, and limitations, Ms. Kring provided informed consent to pursue genetic testing and the blood sample was sent to [facility] for analysis of the Multi-Cancer +RNA Panel. Results should be available within approximately 3 weeks' time, at which point they will be disclosed by telephone to Ms. Primm, as will any additional recommendations warranted by these results. Ms. Rayborn will receive a summary of her genetic counseling visit and a copy of her results once available. This information will also be available in Epic.    Ms. Oramas questions were answered to her satisfaction today. Our contact information was provided should additional questions or concerns arise. Thank you for the referral and allowing [place] to share in the care of your patient.   Kieara Schwark M. Rennie Plowman, MS, [facility] Genetic Counselor [email or web address] ([email or web address] [phone]  The patient was seen for a total of 30 minutes in face-to-face genetic counseling.  She was accompanied by  her son, Lorin Picket. Drs. Pamelia Hoit and/or Mosetta Putt were available to discuss this case as needed.    _______________________________________________________________________ For Office Staff:  Number of people involved in session: 2 Was an Intern/ student involved with case: no

## 2022-08-15 ENCOUNTER — Encounter: Payer: Self-pay | Admitting: Genetic Counselor

## 2022-08-15 ENCOUNTER — Telehealth: Payer: Self-pay | Admitting: Genetic Counselor

## 2022-08-15 DIAGNOSIS — Z1379 Encounter for other screening for genetic and chromosomal anomalies: Secondary | ICD-10-CM | POA: Insufficient documentation

## 2022-08-15 NOTE — Telephone Encounter (Signed)
Contacted patient in attempt to disclose results of genetic testing.  LVM with contact information requesting a call back.  

## 2022-08-21 NOTE — Progress Notes (Signed)
Patient Care Team: Chilton Greathouse, MD as PCP - General (Internal Medicine) Pershing Proud, RN as Oncology Nurse Navigator Donnelly Angelica, RN as Oncology Nurse Navigator Emelia Loron, MD as Consulting Physician (General Surgery) Serena Croissant, MD as Consulting Physician (Hematology and Oncology)  DIAGNOSIS:  Encounter Diagnosis  Name Primary?   Malignant neoplasm of upper-outer quadrant of left breast in female, estrogen receptor positive (HCC) Yes    SUMMARY OF ONCOLOGIC HISTORY: Oncology History  Malignant neoplasm of upper-outer quadrant of left breast in female, estrogen receptor positive (HCC)  05/17/2022 Initial Diagnosis   Mammogram and ultrasound detected left breast cancer 8 cm size biopsy revealed grade 2 ILC with LCIS, lymph node positive for cancer, ER 100%, PR 100%, Ki67 20%, HER2 1+   05/23/2022 Cancer Staging   Staging form: Breast, AJCC 8th Edition - Clinical: Stage IIA (cT3, cN1, cM0, G2, ER+, PR+, HER2-) - Signed by Serena Croissant, MD on 05/23/2022 Histologic grading system: 3 grade system   08/15/2022 Genetic Testing   Neg[facility]ve [facility] Multi-Cancer +RNA Panel.  VUS in NTHL1 at c.68T>C (p.Leu23Pro). Report date is 08/15/2022.   The Multi-Cancer + RNA Panel offered by [facility] includes sequencing and/or deletion/duplication analysis of the following 70 genes:  AIP*, ALK, APC*, ATM*, AXIN2*, BAP1*, BARD1*, BLM*, BMPR1A*, BRCA1*, BRCA2*, BRIP1*, CDC73*, CDH1*, CDK4, CDKN1B*, CDKN2A, CHEK2*, CTNNA1*, DICER1*, EPCAM (del/dup only), EGFR, FH*, FLCN*, GREM1 (promoter dup only), HOXB13, KIT, LZTR1, MAX*, MBD4, MEN1*, MET, MITF, MLH1*, MSH2*, MSH3*, MSH6*, MUTYH*, NF1*, NF2*, NTHL1*, PALB2*, PDGFRA, PMS2*, POLD1*, POLE*, POT1*, PRKAR1A*, PTCH1*, PTEN*, RAD51C*, RAD51D*, RB1*, RET, SDHA* (sequencing only), SDHAF2*, SDHB*, SDHC*, SDHD*, SMAD4*, SMARCA4*, SMARCB1*, SMARCE1*, STK11*, SUFU*, TMEM127*, TP53*, TSC1*, TSC2*, VHL*. RNA analysis is performed for * genes.      CHIEF COMPLIANT: Follow-up after mammogram on neoadjuvant antisurgeon therapy with anastrozole  INTERVAL HISTORY: April Klein is a 82 y.o. female is here because of recent diagnosis of left breast cancer.  Currently on treatment with neoadjuvant antiestrogen therapy with anastrozole. She is tolerating anastrozole extremely well.  She reports no adverse effects to the treatment.  She had a mammogram after 3 months on the hormone therapy and is here today to discuss results.  ALLERGIES:  is allergic to cortisone, povidone-iodine, povidone iodine, statins, and sulfa antibiotics.  MEDICATIONS:  Current Outpatient Medications  Medication Sig Dispense Refill   anastrozole (ARIMIDEX) 1 MG tablet Take 1 tablet (1 mg total) by mouth daily. 90 tablet 3   B Complex Vitamins (VITAMIN B COMPLEX PO) Take 1 capsule by mouth daily.     Cholecalciferol 50 MCG (2000 UT) CAPS Take by mouth.     Cranberry 400 MG CAPS cranberry 400 mg capsule  Take 1 capsule every day by oral route.     ezetimibe (ZETIA) 10 MG tablet Take 10 mg by mouth daily.     levothyroxine (SYNTHROID) 50 MCG tablet Take 50 mcg by mouth every morning.     Lutein 20 MG TABS lutein 20 mg tablet  Take 1 tablet every day by oral route.     meloxicam (MOBIC) 15 MG tablet meloxicam 15 mg tablet     meloxicam (MOBIC) 7.5 MG tablet meloxicam 7.5 mg tablet     Multiple Vitamins-Minerals (PRESERVISION AREDS 2+MULTI VIT PO) PreserVision AREDS  take 2 tabs daily     mupirocin ointment (BACTROBAN) 2 % APPLY TO AFFECTED AREA 3 TIMES A DAY 22 g 2   No current facility-administered medications for this visit.    PHYSICAL  EXAMINATION: ECOG PERFORMANCE STATUS: 1 - Symptomatic but completely ambulatory  Vitals:   08/30/22 1112  BP: (!) 177/65  Pulse: 65  Resp: 18  Temp: 97.9 F (36.6 C)  SpO2: 97%   Filed Weights   08/30/22 1112  Weight: 203 lb 3.2 oz (92.2 kg)     LABORATORY DATA:  I have reviewed the data as listed     04/14/2007    3:06 AM  CMP  Glucose 137   BUN 11   Creatinine 0.79   Sodium 140   Potassium 4.3   Chloride 105   CO2 24   Calcium 9.3     Lab Results  Component Value Date   HGB 12.7 04/14/2007   HCT 36.9 04/14/2007    ASSESSMENT & PLAN:  Malignant neoplasm of upper-outer quadrant of left breast in female, estrogen receptor positive (HCC) 05/17/2022:Mammogram and ultrasound detected left breast cancer 8 cm size biopsy revealed grade 2 ILC with LCIS, lymph node positive for cancer, ER 100%, PR 100%, Ki67 20%, HER2 1+   Recommendations: 1.  Neoadjuvant antiestrogen therapy with anastrozole x 6 months 2. surgery (patient is not very interested in doing surgery but she is willing to consider it after 6 months of neoadjuvant antiestrogen therapy) 3. Adjuvant radiation therapy followed by 3.  Continuation of adjuvant antiestrogen therapy   Given the lobular nature of her breast cancer and her age we decided that chemotherapy would not be in her best interest.  She is in complete agreement and wants [place] to focus on her quality of life.   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  Mammogram and ultrasound 08/29/2022 at [facility]: Marked reduction in the density and conspicuity of the left breast cancer overall size is relatively similar 10 x 6 x 8.4 cm (used to be 10 x 6 x 9 cm)  We would like to obtain a mammogram ultrasound and a breast MRI in 3 months and follow-up after that. She has appointments to see Dr. Dwain Sarna  No orders of the defined types were placed in this encounter.  The patient has a good understanding of the overall plan. she agrees with it. she will call with any problems that may develop before the next visit here. Total time spent: 30 mins including face to face time and time spent for planning, charting and co-ordination of care   Tamsen Meek, MD 08/30/22    I  Janan Ridge am acting as a Neurosurgeon for [employer]  I have reviewed the above documentation for accuracy and completeness, and I agree with the above.

## 2022-08-22 ENCOUNTER — Ambulatory Visit: Payer: Self-pay | Admitting: Genetic Counselor

## 2022-08-22 DIAGNOSIS — Z1379 Encounter for other screening for genetic and chromosomal anomalies: Secondary | ICD-10-CM

## 2022-08-22 DIAGNOSIS — C50412 Malignant neoplasm of upper-outer quadrant of left female breast: Secondary | ICD-10-CM

## 2022-08-22 DIAGNOSIS — Z800 Family history of malignant neoplasm of digestive organs: Secondary | ICD-10-CM

## 2022-08-22 NOTE — Telephone Encounter (Signed)
Disclosed negative [facility] genetics and VUS in NTHL1.

## 2022-08-24 ENCOUNTER — Telehealth: Payer: Self-pay | Admitting: Hematology and Oncology

## 2022-08-24 NOTE — Telephone Encounter (Signed)
Left a message regarding appointment information

## 2022-08-30 ENCOUNTER — Inpatient Hospital Stay: Payer: Medicare Other | Attending: Hematology and Oncology | Admitting: Hematology and Oncology

## 2022-08-30 ENCOUNTER — Other Ambulatory Visit: Payer: Self-pay

## 2022-08-30 VITALS — BP 177/65 | HR 65 | Temp 97.9°F | Resp 18 | Wt 203.2 lb

## 2022-08-30 DIAGNOSIS — C50412 Malignant neoplasm of upper-outer quadrant of left female breast: Secondary | ICD-10-CM | POA: Diagnosis present

## 2022-08-30 DIAGNOSIS — Z79811 Long term (current) use of aromatase inhibitors: Secondary | ICD-10-CM | POA: Insufficient documentation

## 2022-08-30 DIAGNOSIS — Z170 Estrogen receptor positive status [ER+]: Secondary | ICD-10-CM | POA: Insufficient documentation

## 2022-08-30 NOTE — Assessment & Plan Note (Addendum)
05/17/2022:Mammogram and ultrasound detected left breast cancer 8 cm size biopsy revealed grade 2 ILC with LCIS, lymph node positive for cancer, ER 100%, PR 100%, Ki67 20%, HER2 1+   Recommendations: 1. Breast conserving surgery followed by 2. Adjuvant radiation therapy followed by 3. Adjuvant antiestrogen therapy   Given the lobular nature of her breast cancer and her age we decided that chemotherapy would not be in her best interest.  She is in complete agreement and wants [place] to focus on her quality of life.   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

## 2022-08-31 ENCOUNTER — Encounter: Payer: Self-pay | Admitting: Hematology and Oncology

## 2022-08-31 ENCOUNTER — Encounter: Payer: Self-pay | Admitting: *Deleted

## 2022-09-10 LAB — COLOGUARD: COLOGUARD: NEGATIVE

## 2022-09-13 NOTE — Progress Notes (Signed)
HPI:   April Klein was previously seen in the [facility] due to a personal history of breast cancer, family history of pancreatic cancer and concerns regarding a hereditary predisposition to cancer.    April Klein recent genetic test results were disclosed to her by telephone. These results and recommendations are discussed in more detail below.  CANCER HISTORY:  Oncology History  Malignant neoplasm of upper-outer quadrant of left breast in female, estrogen receptor positive (HCC)  05/17/2022 Initial Diagnosis   Mammogram and ultrasound detected left breast cancer 8 cm size biopsy revealed grade 2 ILC with LCIS, lymph node positive for cancer, ER 100%, PR 100%, Ki67 20%, HER2 1+   05/23/2022 Cancer Staging   Staging form: Breast, AJCC 8th Edition - Clinical: Stage IIA (cT3, cN1, cM0, G2, ER+, PR+, HER2-) - Signed by Serena Croissant, MD on 05/23/2022 Histologic grading system: 3 grade system   08/15/2022 Genetic Testing   Neg[facility]ve [facility] Multi-Cancer +RNA Panel.  VUS in NTHL1 at c.68T>C (p.Leu23Pro). Report date is 08/15/2022.   The Multi-Cancer + RNA Panel offered by [facility] includes sequencing and/or deletion/duplication analysis of the following 70 genes:  AIP*, ALK, APC*, ATM*, AXIN2*, BAP1*, BARD1*, BLM*, BMPR1A*, BRCA1*, BRCA2*, BRIP1*, CDC73*, CDH1*, CDK4, CDKN1B*, CDKN2A, CHEK2*, CTNNA1*, DICER1*, EPCAM (del/dup only), EGFR, FH*, FLCN*, GREM1 (promoter dup only), HOXB13, KIT, LZTR1, MAX*, MBD4, MEN1*, MET, MITF, MLH1*, MSH2*, MSH3*, MSH6*, MUTYH*, NF1*, NF2*, NTHL1*, PALB2*, PDGFRA, PMS2*, POLD1*, POLE*, POT1*, PRKAR1A*, PTCH1*, PTEN*, RAD51C*, RAD51D*, RB1*, RET, SDHA* (sequencing only), SDHAF2*, SDHB*, SDHC*, SDHD*, SMAD4*, SMARCA4*, SMARCB1*, SMARCE1*, STK11*, SUFU*, TMEM127*, TP53*, TSC1*, TSC2*, VHL*. RNA analysis is performed for * genes.     FAMILY HISTORY:  We obtained a detailed, 4-generation family history.  Significant diagnoses are listed below:       Family History  Problem Relation Age of Onset   Pancreatic cancer Father 38       April Klein is unaware of previous family history of genetic testing for hereditary cancer risks.  There is no reported Ashkenazi Jewish ancestry. There is no known consanguinity.  GENETIC TEST RESULTS:  The [facility] Multi-Cancer +RNA Panel found no pathogenic mutations.   The Multi-Cancer + RNA Panel offered by [facility] includes sequencing and/or deletion/duplication analysis of the following 70 genes:  AIP*, ALK, APC*, ATM*, AXIN2*, BAP1*, BARD1*, BLM*, BMPR1A*, BRCA1*, BRCA2*, BRIP1*, CDC73*, CDH1*, CDK4, CDKN1B*, CDKN2A, CHEK2*, CTNNA1*, DICER1*, EPCAM (del/dup only), EGFR, FH*, FLCN*, GREM1 (promoter dup only), HOXB13, KIT, LZTR1, MAX*, MBD4, MEN1*, MET, MITF, MLH1*, MSH2*, MSH3*, MSH6*, MUTYH*, NF1*, NF2*, NTHL1*, PALB2*, PDGFRA, PMS2*, POLD1*, POLE*, POT1*, PRKAR1A*, PTCH1*, PTEN*, RAD51C*, RAD51D*, RB1*, RET, SDHA* (sequencing only), SDHAF2*, SDHB*, SDHC*, SDHD*, SMAD4*, SMARCA4*, SMARCB1*, SMARCE1*, STK11*, SUFU*, TMEM127*, TP53*, TSC1*, TSC2*, VHL*. RNA analysis is performed for * genes.   The test report has been scanned into EPIC and is located under the Molecular Pathology section of the Results Review tab.  A portion of the result report is included below for reference. Genetic testing reported out on Aug 15, 2022.     Genetic testing identified a variant of uncertain significance (VUS) in the NTHL1 gene called c.68T>C (p.Leu23Pro).  At this time, it is unknown if this variant is associated with an increased risk for cancer or if it is benign, but most uncertain variants are reclassified to benign. It should not be used to make medical management decisions. With time, we suspect the laboratory will determine the significance of this variant, if any. If the laboratory reclassifies  this variant, we will attempt to contact April Klein to discuss it further.   Even though a pathogenic variant was not  identified, possible explanations for the cancer in the family may include: There may be no hereditary risk for cancer in the family. The cancers in April Klein and/or her family may be sporadic/familial or due to other genetic and environmental factors. There may be a gene mutation in one of these genes that current testing methods cannot detect but that chance is small. There could be another gene that has not yet been discovered, or that we have not yet tested, that is responsible for the cancer diagnoses in the family.  It is also possible there is a hereditary cause for the cancer in the family that April Klein did not inherit.   Therefore, it is important to remain in touch with cancer genetics in the future so that we can continue to offer April Klein the most up to date genetic testing.     ADDITIONAL GENETIC TESTING:   April Klein genetic testing was fairly extensive.  If there are additional relevant genes identified to increase cancer risk that can be analyzed in the future, we would be happy to discuss and coordinate this testing at that time.      CANCER SCREENING RECOMMENDATIONS:  April Klein test result is considered negative (normal).  This means that we have not identified a hereditary cause for her personal history of breast cancer at this time.   An individual's cancer risk and medical management are not determined by genetic test results alone. Overall cancer risk assessment incorporates additional factors, including personal medical history, family history, and any available genetic information that may result in a personalized plan for cancer prevention and surveillance. Therefore, it is recommended she continue to follow the cancer management and screening guidelines provided by her oncology and primary healthcare provider.   RECOMMENDATIONS FOR FAMILY MEMBERS:   Since she did not inherit a identifiable mutation in a cancer predisposition gene included on this panel, her  children could not have inherited a known mutation from her in one of these genes. Individuals in this family might be at some increased risk of developing cancer, over the general population risk, due to the family history of cancer.  Individuals in the family should notify their providers of the family history of cancer. We recommend women in this family have a yearly mammogram beginning at age 61, or 100 years younger than the earliest onset of cancer, an annual clinical breast exam, and perform monthly breast self-exams.  Risk models that take into account family history and hormonal history may be helpful in determining appropriate breast cancer screening options for family members. Female relatives should speak with their providers about prostate cancer screening.  We do not recommend familial testing for the NTHL1 variant of uncertain significance (VUS).  FOLLOW-UP:  Cancer genetics is a rapidly advancing field and it is possible that new genetic tests will be appropriate for her and/or her family members in the future. We encourage April Klein to remain in contact with cancer genetics, so we can update her personal and family histories and let her know of advances in cancer genetics that may benefit this family.   Our contact number was provided.  She knows she is welcome to call us at anytime with additional questions or concerns.   Mel Langan M. Rennie Plowman, MS, [facility] Genetic Counselor [email or web address] (P) [phone]

## 2022-09-14 ENCOUNTER — Encounter: Payer: Self-pay | Admitting: *Deleted

## 2022-11-28 LAB — HM MAMMOGRAPHY

## 2022-12-05 ENCOUNTER — Ambulatory Visit
Admission: RE | Admit: 2022-12-05 | Discharge: 2022-12-05 | Disposition: A | Discharge status: Home / Self Care | Payer: Medicare Other | Source: Ambulatory Visit | Attending: Hematology and Oncology | Admitting: Hematology and Oncology

## 2022-12-05 DIAGNOSIS — C50412 Malignant neoplasm of upper-outer quadrant of left female breast: Secondary | ICD-10-CM

## 2022-12-05 MED ORDER — GADOPICLENOL 0.5 MMOL/ML IV SOLN
10.0000 mL | Freq: Once | INTRAVENOUS | Status: AC | PRN
  Administered 2022-12-05: 10 mL via INTRAVENOUS

## 2022-12-06 ENCOUNTER — Encounter: Payer: Self-pay | Admitting: *Deleted

## 2022-12-06 ENCOUNTER — Other Ambulatory Visit: Payer: Self-pay | Admitting: Hematology and Oncology

## 2022-12-06 ENCOUNTER — Telehealth: Payer: Self-pay | Admitting: *Deleted

## 2022-12-06 DIAGNOSIS — R928 Other abnormal and inconclusive findings on diagnostic imaging of breast: Secondary | ICD-10-CM

## 2022-12-06 NOTE — Telephone Encounter (Signed)
This RN spoke with pt per her call stating concern over request for biopsy per MRI yesterday.  She states she was called by a scheduling person but that information as to what and why she needed biopsy could not be explained.  Note pt is being treated with neoadjuvant anastrozole for known lobular breast cancer.  Above MRI is 1st one obtained for evaluation.  Discussed with pt concerns- with plan to hold on any biopsies until she sees Dr Dwain Sarna tomorrow and then Dr Pamelia Hoit on Monday 9/16.  Pt stated appreciation of call and discussion.

## 2022-12-11 ENCOUNTER — Inpatient Hospital Stay: Payer: Medicare Other | Attending: Hematology and Oncology | Admitting: Hematology and Oncology

## 2022-12-11 ENCOUNTER — Encounter: Payer: Self-pay | Admitting: *Deleted

## 2022-12-11 VITALS — BP 153/74 | HR 87 | Temp 97.3°F | Resp 18 | Wt 204.9 lb

## 2022-12-11 DIAGNOSIS — Z79811 Long term (current) use of aromatase inhibitors: Secondary | ICD-10-CM | POA: Diagnosis not present

## 2022-12-11 DIAGNOSIS — C50412 Malignant neoplasm of upper-outer quadrant of left female breast: Secondary | ICD-10-CM | POA: Insufficient documentation

## 2022-12-11 DIAGNOSIS — Z170 Estrogen receptor positive status [ER+]: Secondary | ICD-10-CM | POA: Diagnosis not present

## 2022-12-11 NOTE — Assessment & Plan Note (Signed)
05/17/2022:Mammogram and ultrasound detected left breast cancer 8 cm size biopsy revealed grade 2 ILC with LCIS, lymph node positive for cancer, ER 100%, PR 100%, Ki67 20%, HER2 1+    Recommendations: 1.  Neoadjuvant antiestrogen therapy with anastrozole x 6 months 2. surgery (patient is not very interested in doing surgery but she is willing to consider it after 6 months of neoadjuvant antiestrogen therapy) 3. Adjuvant radiation therapy followed by 3.  Continuation of adjuvant antiestrogen therapy --------------------------------------------------------------------------------------------------------------------- 12/05/2022: Breast MRI: Large area of subtle increased enhancement central left breast overall 10.1 cm Recommendation is to biopsy focal area of enhancement  Discussion: Discussed with the patient about role of surgery versus palliative antiestrogen therapy

## 2022-12-11 NOTE — Progress Notes (Signed)
Patient Care Team: Chilton Greathouse, MD as PCP - General (Internal Medicine) Pershing Proud, RN as Oncology Nurse Navigator Donnelly Angelica, RN as Oncology Nurse Navigator Emelia Loron, MD as Consulting Physician (General Surgery) Serena Croissant, MD as Consulting Physician (Hematology and Oncology)  DIAGNOSIS:  Encounter Diagnosis  Name Primary?   Malignant neoplasm of upper-outer quadrant of left breast in female, estrogen receptor positive (HCC) Yes    SUMMARY OF ONCOLOGIC HISTORY: Oncology History  Malignant neoplasm of upper-outer quadrant of left breast in female, estrogen receptor positive (HCC)  05/17/2022 Initial Diagnosis   Mammogram and ultrasound detected left breast cancer 8 cm size biopsy revealed grade 2 ILC with LCIS, lymph node positive for cancer, ER 100%, PR 100%, Ki67 20%, HER2 1+   05/23/2022 Cancer Staging   Staging form: Breast, AJCC 8th Edition - Clinical: Stage IIA (cT3, cN1, cM0, G2, ER+, PR+, HER2-) - Signed by Serena Croissant, MD on 05/23/2022 Histologic grading system: 3 grade system   08/15/2022 Genetic Testing   Negaitve [facility] Multi-Cancer +RNA Panel.  VUS in NTHL1 at c.68T>C (p.Leu23Pro). Report date is 08/15/2022.   The Multi-Cancer + RNA Panel offered by [facility] includes sequencing and/or deletion/duplication analysis of the following 70 genes:  AIP*, ALK, APC*, ATM*, AXIN2*, BAP1*, BARD1*, BLM*, BMPR1A*, BRCA1*, BRCA2*, BRIP1*, CDC73*, CDH1*, CDK4, CDKN1B*, CDKN2A, CHEK2*, CTNNA1*, DICER1*, EPCAM (del/dup only), EGFR, FH*, FLCN*, GREM1 (promoter dup only), HOXB13, KIT, LZTR1, MAX*, MBD4, MEN1*, MET, MITF, MLH1*, MSH2*, MSH3*, MSH6*, MUTYH*, NF1*, NF2*, NTHL1*, PALB2*, PDGFRA, PMS2*, POLD1*, POLE*, POT1*, PRKAR1A*, PTCH1*, PTEN*, RAD51C*, RAD51D*, RB1*, RET, SDHA* (sequencing only), SDHAF2*, SDHB*, SDHC*, SDHD*, SMAD4*, SMARCA4*, SMARCB1*, SMARCE1*, STK11*, SUFU*, TMEM127*, TP53*, TSC1*, TSC2*, VHL*. RNA analysis is performed for * genes.      CHIEF COMPLIANT:   Discussed the use of AI scribe software for clinical note transcription with the patient, who gave verbal consent to proceed.  History of Present Illness   The patient, with a history of invasive lobular breast cancer, presents with concerns about recent MRI findings. The MRI showed non-mass enhancement in the left breast, which has caused the patient some anxiety. The patient reports no new symptoms or changes in physical health. The patient mentions a node under the arm, which was previously noted but seems to have reduced in size. The patient has been on anastrozole for six months and reports no side effects. The patient is active, with upcoming work and personal travel plans.         ALLERGIES:  is allergic to cortisone, povidone-iodine, povidone iodine, statins, and sulfa antibiotics.  MEDICATIONS:  Current Outpatient Medications  Medication Sig Dispense Refill   anastrozole (ARIMIDEX) 1 MG tablet Take 1 tablet (1 mg total) by mouth daily. 90 tablet 3   B Complex Vitamins (VITAMIN B COMPLEX PO) Take 1 capsule by mouth daily.     Cholecalciferol 50 MCG (2000 UT) CAPS Take by mouth.     Cranberry 400 MG CAPS cranberry 400 mg capsule  Take 1 capsule every day by oral route.     ezetimibe (ZETIA) 10 MG tablet Take 10 mg by mouth daily.     levothyroxine (SYNTHROID) 50 MCG tablet Take 50 mcg by mouth every morning.     meloxicam (MOBIC) 7.5 MG tablet meloxicam 7.5 mg tablet     Multiple Vitamins-Minerals (PRESERVISION AREDS 2+MULTI VIT PO) PreserVision AREDS  take 2 tabs daily     mupirocin ointment (BACTROBAN) 2 % APPLY TO AFFECTED AREA 3 TIMES A DAY  22 g 2   Vitamin D, Ergocalciferol, (DRISDOL) 1.25 MG (50000 UNIT) CAPS capsule Take 50,000 Units by mouth once a week.     Lutein 20 MG TABS lutein 20 mg tablet  Take 1 tablet every day by oral route.     prednisoLONE acetate (PRED FORTE) 1 % ophthalmic suspension Place 1 drop into the left eye daily.     No  current facility-administered medications for this visit.    PHYSICAL EXAMINATION: ECOG PERFORMANCE STATUS: 1 - Symptomatic but completely ambulatory  Vitals:   12/11/22 1201  BP: (!) 153/74  Pulse: 87  Resp: 18  Temp: (!) 97.3 F (36.3 C)  SpO2: 100%   Filed Weights   12/11/22 1201  Weight: 204 lb 14.4 oz (92.9 kg)    Physical Exam          (exam performed in the presence of a chaperone)  LABORATORY DATA:  I have reviewed the data as listed    04/14/2007    3:06 AM  CMP  Glucose 137   BUN 11   Creatinine 0.79   Sodium 140   Potassium 4.3   Chloride 105   CO2 24   Calcium 9.3     Lab Results  Component Value Date   HGB 12.7 04/14/2007   HCT 36.9 04/14/2007    ASSESSMENT & PLAN:  Malignant neoplasm of upper-outer quadrant of left breast in female, estrogen receptor positive (HCC) 05/17/2022:Mammogram and ultrasound detected left breast cancer 8 cm size biopsy revealed grade 2 ILC with LCIS, lymph node positive for cancer, ER 100%, PR 100%, Ki67 20%, HER2 1+    Recommendations: 1.  Neoadjuvant antiestrogen therapy with anastrozole x 6 months 2. surgery (patient is not very interested in doing surgery but she is willing to consider it after 6 months of neoadjuvant antiestrogen therapy) 3. Adjuvant radiation therapy followed by 3.  Continuation of adjuvant antiestrogen therapy --------------------------------------------------------------------------------------------------------------------- 12/05/2022: Breast MRI: Large area of subtle increased enhancement central left breast overall 10.1 cm Reviewed the MRI  Discussion: patient met with Dr.Wakefield and there is no plan for surgery ------------------------------------- Assessment and Plan    Invasive Lobular Carcinoma MRI shows non-mass enhancement in the left breast. Patient is currently on anastrozole. -Continue anastrozole. -Repeat MRI in three months (December 16th) at [facility]. -Order mammogram  for the first week of December. -Follow-up appointment in three months.          Orders Placed This Encounter  Procedures   MR BREAST BILATERAL W WO CONTRAST INC CAD    Standing Status:   Future    Standing Expiration Date:   12/11/2023    Order Specific Question:   If indicated for the ordered procedure, I authorize the administration of contrast media per Radiology protocol    Answer:   Yes    Order Specific Question:   What is the patient's sedation requirement?    Answer:   No Sedation    Order Specific Question:   Does the patient have a pacemaker or implanted devices?    Answer:   No    Order Specific Question:   Preferred imaging location?    Answer:   [facility] (table limit - 550 lbs)   MM DIAG BREAST TOMO UNI LEFT    Standing Status:   Future    Standing Expiration Date:   12/11/2023    Order Specific Question:   Reason for Exam (SYMPTOM  OR DIAGNOSIS REQUIRED)    Answer:  Left breast cancer    Order Specific Question:   Preferred imaging location?    Answer:   External    Comments:   [facility]    Order Specific Question:   Release to patient    Answer:   Immediate   [place] LIMITED ULTRASOUND INCLUDING AXILLA LEFT BREAST     Standing Status:   Future    Standing Expiration Date:   12/11/2023    Order Specific Question:   Reason for Exam (SYMPTOM  OR DIAGNOSIS REQUIRED)    Answer:   Breast cancer    Comments:   [facility]    Order Specific Question:   Preferred imaging location?    Answer:   External    Order Specific Question:   Release to patient    Answer:   Immediate   The patient has a good understanding of the overall plan. she agrees with it. she will call with any problems that may develop before the next visit here. Total time spent: 30 mins including face to face time and time spent for planning, charting and co-ordination of care   Tamsen Meek, MD 12/11/22

## 2022-12-13 ENCOUNTER — Encounter: Payer: Self-pay | Admitting: *Deleted

## 2022-12-20 ENCOUNTER — Inpatient Hospital Stay (HOSPITAL_BASED_OUTPATIENT_CLINIC_OR_DEPARTMENT_OTHER): Payer: Medicare Other | Admitting: Hematology and Oncology

## 2022-12-20 DIAGNOSIS — Z170 Estrogen receptor positive status [ER+]: Secondary | ICD-10-CM

## 2022-12-20 DIAGNOSIS — C50412 Malignant neoplasm of upper-outer quadrant of left female breast: Secondary | ICD-10-CM | POA: Diagnosis not present

## 2022-12-21 NOTE — Progress Notes (Signed)
HEMATOLOGY-ONCOLOGY TELEPHONE VISIT PROGRESS NOTE  I connected with our patient on 12/21/22 at  3:45 PM EDT by telephone and verified that I am speaking with the correct person using two identifiers.  I discussed the limitations, risks, security and privacy concerns of performing an evaluation and management service by telephone and the availability of in person appointments.  I also discussed with the patient that there may be a patient responsible charge related to this service. The patient expressed understanding and agreed to proceed.   History of Present Illness:  Discussed the use of AI scribe software for clinical note transcription with the patient, who gave verbal consent to proceed.  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 She reports that she has been scheduled for a sonogram of their abdominal aneurysm at another facility and have been advised to have the results sent electronically to their current provider. She express a desire to obtain a copy of their recent breast MRI for her records.  The patient's primary concern is the interpretation of their recent MRI, which revealed a 'hot spot' or 'subtle increased enhancement' measuring 10.1 cm without a dominant mass. She reports that she do not physically feel any abnormalities and that a previous provider also did not palpate any masses. She express uncertainty about the nature of this finding and its implications for her health.  The patient also mentions that she have been in discussion with their providers about the possibility of a mastectomy, but she have decided against this option due to her age. She are currently on anastrozole and are scheduled for a repeat MRI in three months to monitor the progress of her condition.        Oncology History  Malignant neoplasm of upper-outer quadrant  of left breast in female, estrogen receptor positive (HCC)  05/17/2022 Initial Diagnosis   Mammogram and ultrasound detected left breast cancer 8 cm size biopsy revealed grade 2 ILC with LCIS, lymph node positive for cancer, ER 100%, PR 100%, Ki67 20%, HER2 1+   05/23/2022 Cancer Staging   Staging form: Breast, AJCC 8th Edition - Clinical: Stage IIA (cT3, cN1, cM0, G2, ER+, PR+, HER2-) - Signed by Serena Croissant, MD on 05/23/2022 Histologic grading system: 3 grade system   08/15/2022 Genetic Testing   [facility] Multi-Cancer +RNA Panel.  VUS in NTHL1 at c.68T>C (p.Leu23Pro). Report date is 08/15/2022.   The Multi-Cancer + RNA Panel offered by [facility] includes sequencing and/or deletion/duplication analysis of the following 70 genes:  AIP*, ALK, APC*, ATM*, AXIN2*, BAP1*, BARD1*, BLM*, BMPR1A*, BRCA1*, BRCA2*, BRIP1*, CDC73*, CDH1*, CDK4, CDKN1B*, CDKN2A, CHEK2*, CTNNA1*, DICER1*, EPCAM (del/dup only), EGFR, FH*, FLCN*, GREM1 (promoter dup only), HOXB13, KIT, LZTR1, MAX*, MBD4, MEN1*, MET, MITF, MLH1*, MSH2*, MSH3*, MSH6*, MUTYH*, NF1*, NF2*, NTHL1*, PALB2*, PDGFRA, PMS2*, POLD1*, POLE*, POT1*, PRKAR1A*, PTCH1*, PTEN*, RAD51C*, RAD51D*, RB1*, RET, SDHA* (sequencing only), SDHAF2*, SDHB*, SDHC*, SDHD*, SMAD4*, SMARCA4*, SMARCB1*, SMARCE1*, STK11*, SUFU*, TMEM127*, TP53*, TSC1*, TSC2*, VHL*. RNA analysis is performed for * genes.     REVIEW OF SYSTEMS:   Constitutional: Denies fevers, chills or abnormal weight loss All other systems were reviewed with the patient and are negative. Observations/Objective:     Assessment Plan:     Breast Cancer Subtle increased enhancement measuring 10.1 cm on MRI, likely related to lobular cancer and LCIS. No dominant mass identified. Discussed the option of mastectomy,  but patient declined due to age. Uncertainty about the nature of the "hot spot" on MRI. -Continue Anastrozole. -Repeat MRI in December 2024 to monitor changes.  Abdominal Aneurysm Regular  six-month sonogram scheduled for 7th at [facility]. -Plan to have images sent electronically to current provider for review.  Mammogram Patient has a copy of the most recent mammogram and sonogram. -No changes to current plan.          I discussed the assessment and treatment plan with the patient. The patient was provided an opportunity to ask questions and all were answered. The patient agreed with the plan and demonstrated an understanding of the instructions. The patient was advised to call back or seek an in-person evaluation if the symptoms worsen or if the condition fails to improve as anticipated.   I provided 12 minutes of non-face-to-face time during this encounter.  This includes time for charting and coordination of care   Tamsen Meek, MD

## 2022-12-25 ENCOUNTER — Encounter: Payer: Self-pay | Admitting: Hematology and Oncology

## 2022-12-25 ENCOUNTER — Encounter: Payer: Self-pay | Admitting: *Deleted

## 2022-12-25 ENCOUNTER — Other Ambulatory Visit: Payer: Self-pay | Admitting: *Deleted

## 2022-12-29 ENCOUNTER — Telehealth: Payer: Self-pay | Admitting: *Deleted

## 2022-12-29 NOTE — Telephone Encounter (Signed)
Spoke with patient to let her know she does not need an MRI breas, only mammo/us which will be done in December. [facility] will call her with an appt. Patient verbalized understanding.

## 2022-12-31 IMAGING — MG DIGITAL DIAGNOSTIC BILAT W/ TOMO W/ CAD
8 of 15 series · 8 of 40 positions shown · non-contrast
Comparison: 08/17/2020 mammogram and ultrasound from [facility] [REDACTED].

CLINICAL DATA: 80-year-old female with palpable fullness in the
anterior/retroareolar LEFT breast.

EXAM:
DIGITAL DIAGNOSTIC BILATERAL MAMMOGRAM WITH TOMOSYNTHESIS AND CAD;
ULTRASOUND LEFT BREAST LIMITED
TECHNIQUE: Bilateral digital diagnostic mammography and breast tomosynthesis
was performed. The images were evaluated with computer-aided
detection.; Targeted ultrasound examination of the left breast was
performed.

[R MLO synth-2D]
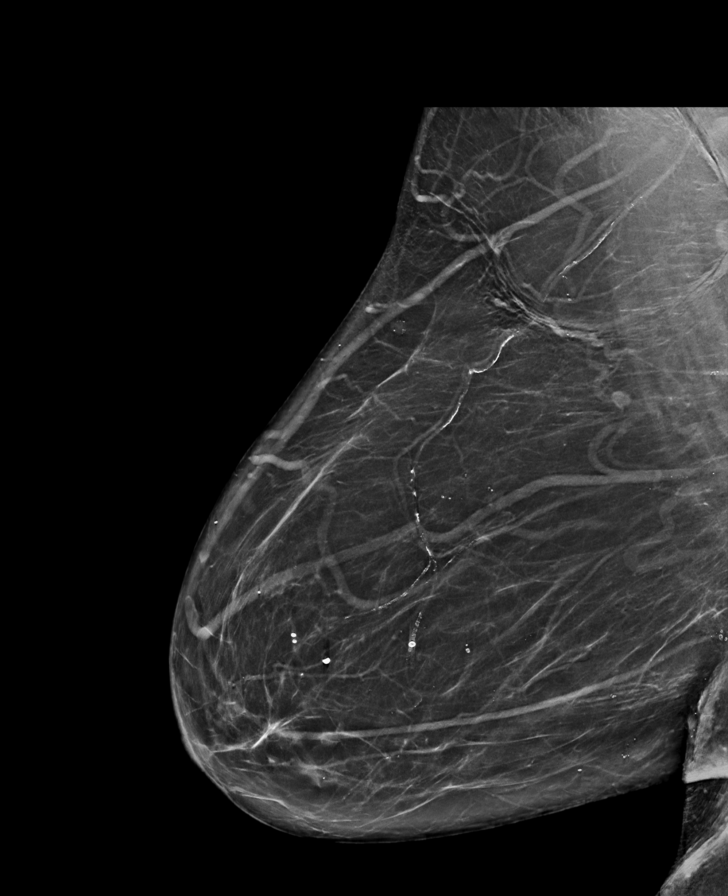

[L MLO synth-2D (1 of 2)]
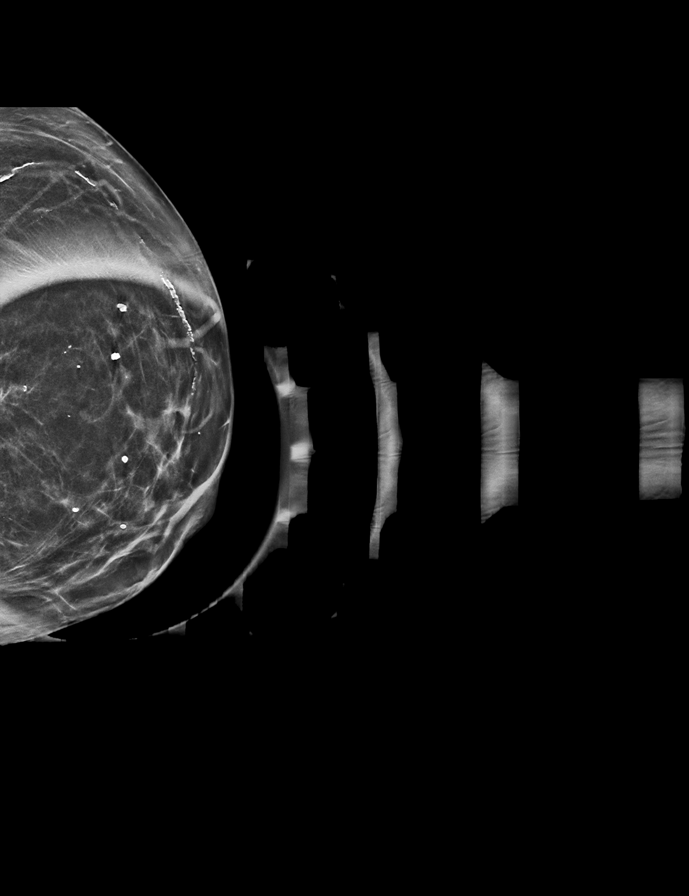

[L CC synth-2D (1 of 2)]
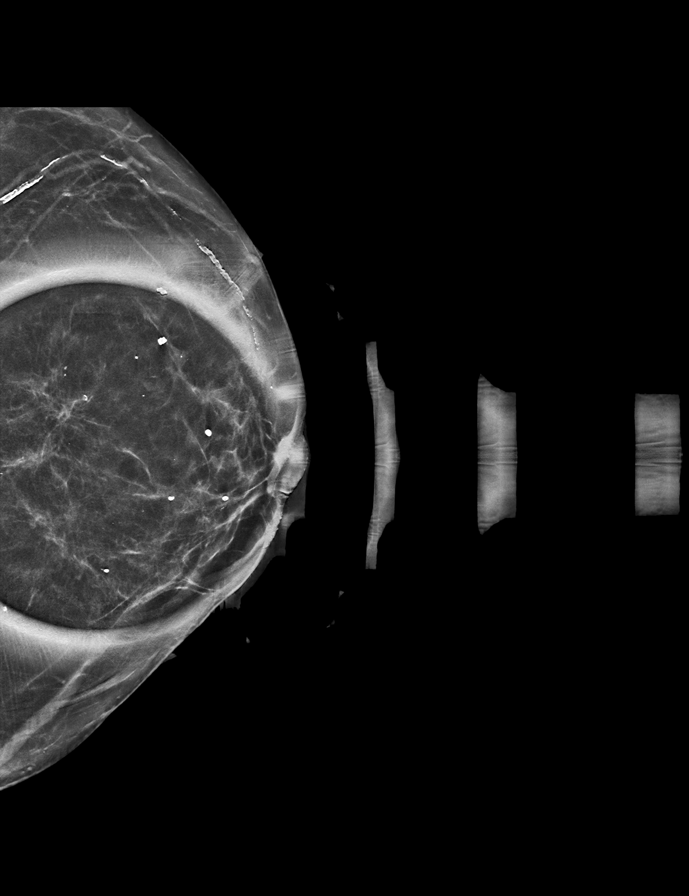

[L CC synth-2D (2 of 2)]
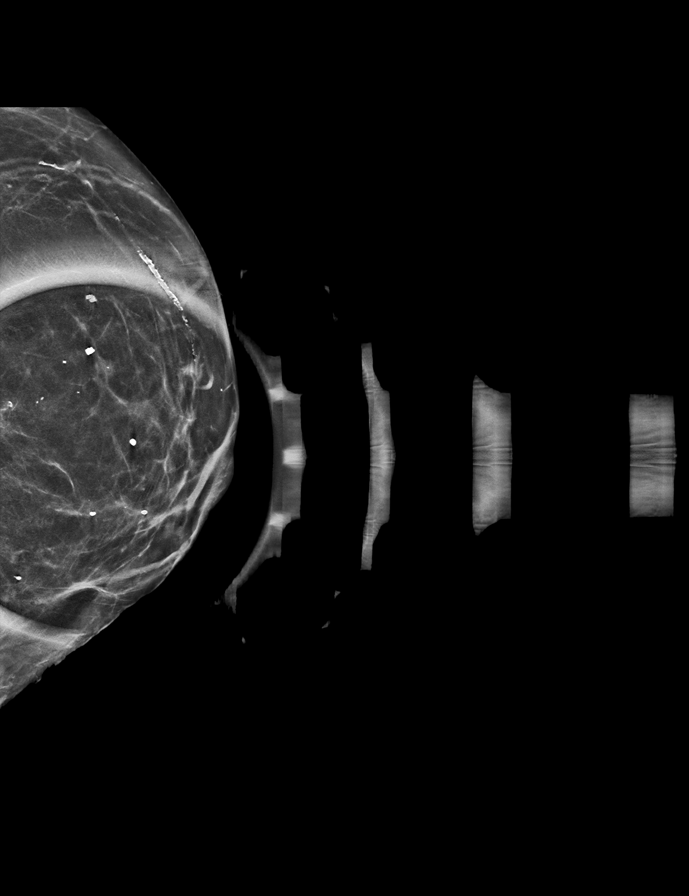

[R CC synth-2D (1 of 2)]
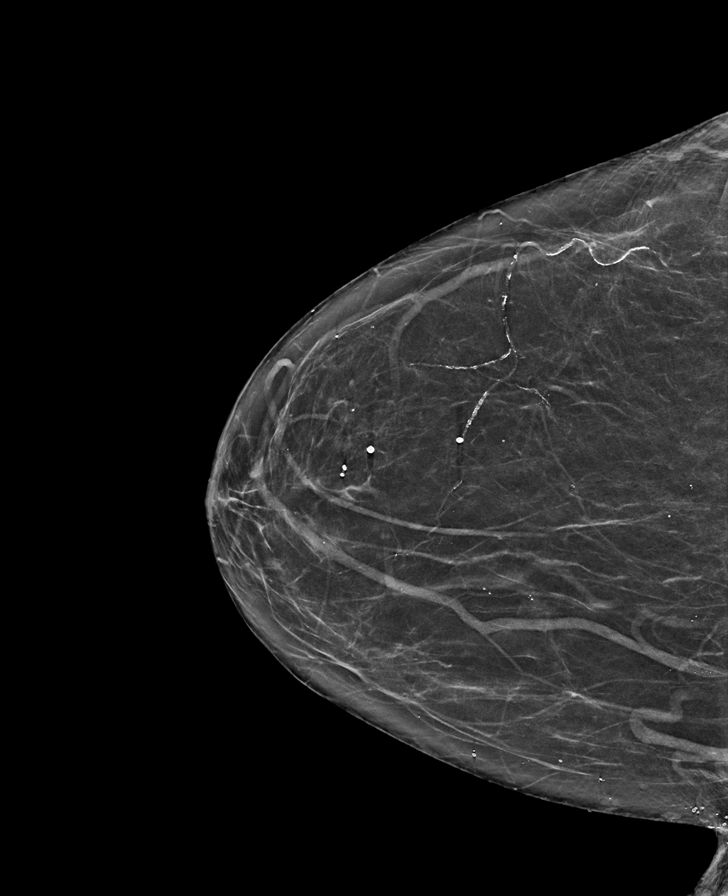

[L MLO synth-2D (2 of 2)]
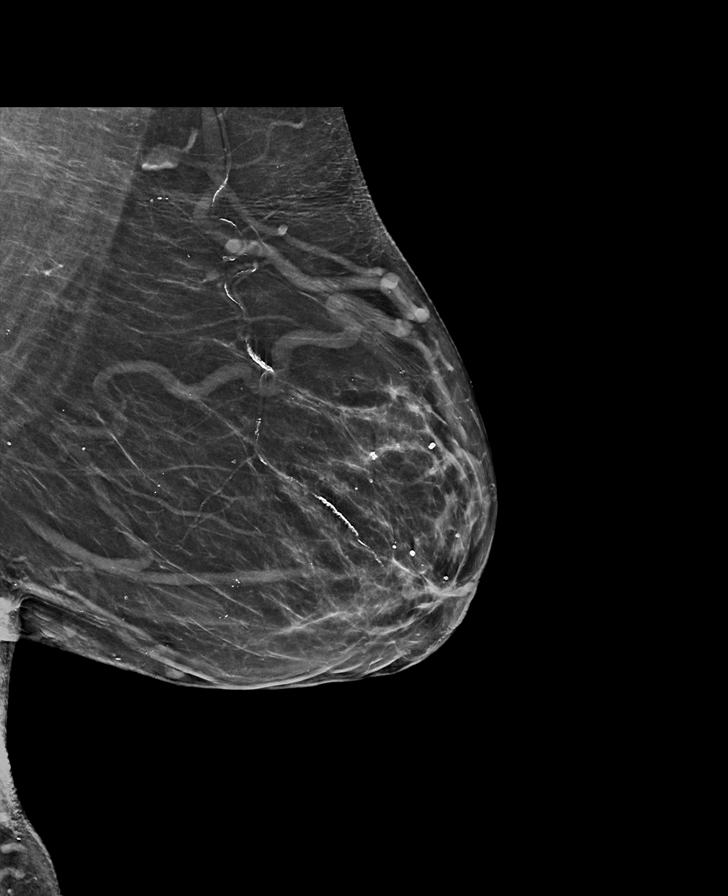

[R CC synth-2D (2 of 2)]
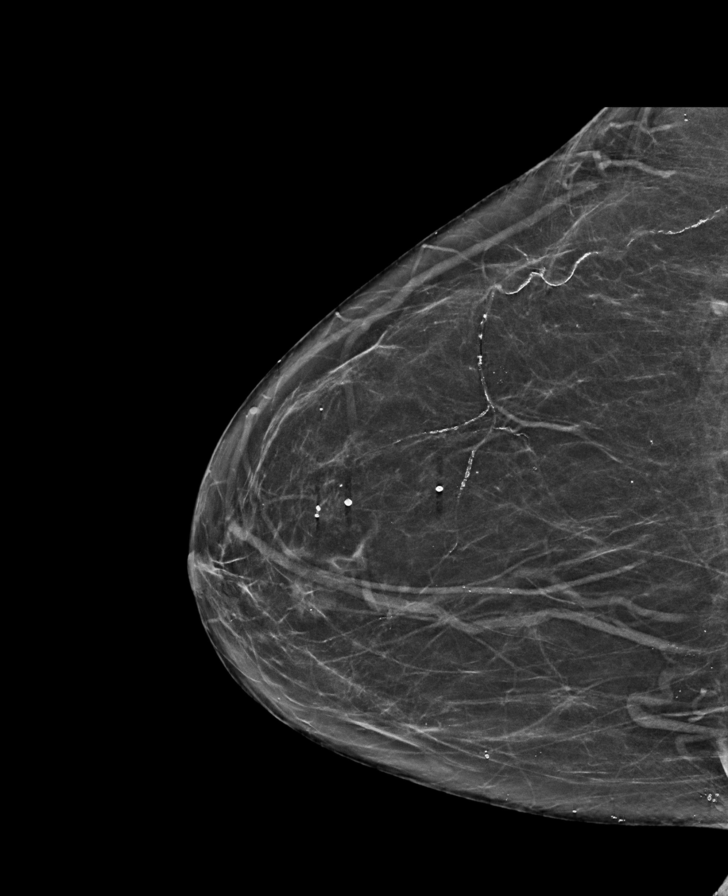

[L MLO tomo · tomo slice 49/72.0]
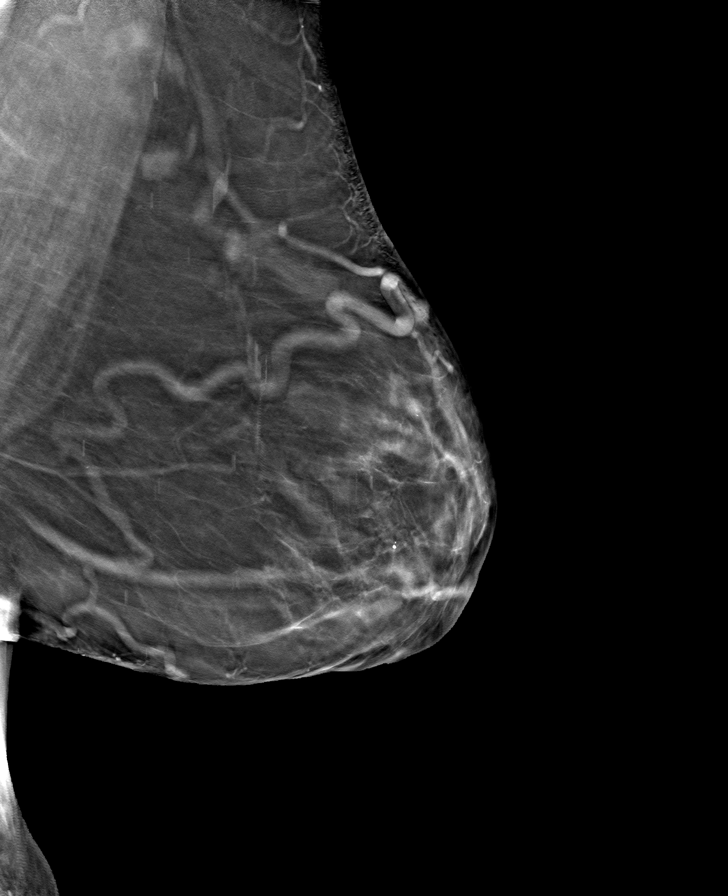

[8 of 40 positions shown; findings below may reference images not displayed]

Mammograms from 5887 to 6467 from the [REDACTED].

ACR Breast Density Category b: There are scattered areas of
fibroglandular density.
FINDINGS: 2D/3D full field views of both breasts and spot compression view of
the LEFT breast demonstrate slightly increased density and
trabecular prominent of the anterior and retroareolar LEFT breast,
which has developed since 6467. There also appears to be some volume
loss within the anterior LEFT breast as well. This entire area spans
a distance of at least 8.5 cm.

No discrete suspicious mass, distortion or worrisome calcifications
are noted within either breast.

On physical exam, the RETROAREOLAR and periareolar anterior LEFT
breast is very firm, without overlying skin changes.

Targeted ultrasound is performed, showing diffuse heterogeneous
appearance of the breast tissue in the anterior, periareolar and
retroareolar LEFT breast with mixed hyperechoic and hypoechoic areas
with ill-defined shadowing. This is significantly different from
same region of the RIGHT breast which has a normal appearance.

No abnormal appearing LEFT axillary lymph nodes are noted.
IMPRESSION: 1. Increased anterior/retroareolar LEFT breast mammographic density
with trabecular prominence and volume loss, palpable firmness
throughout this area on physical examination, and heterogeneous
appearance on sonographic evaluation. This is suspicious for
malignancy, specifically lobular carcinoma. Options of
ultrasound-guided tissue sampling of portions of this area, surgical
consultation and breast MRI were discussed with the patient. We both
agree to initially proceed with MRI for further evaluation.
2. No abnormal appearing LEFT axillary lymph nodes.
3. No mammographic evidence of RIGHT breast malignancy.

RECOMMENDATION:
MRI of both breasts with and without contrast. If MRI demonstrates a
clear suspicious target to biopsy, then MRI guided biopsy would be
recommended. Otherwise ultrasound-guided sampling of the
anterior/retroareolar LEFT breast would be recommended.

I have discussed the findings and recommendations with the patient.
If applicable, a reminder letter will be sent to the patient
regarding the next appointment.

BI-RADS CATEGORY  4: Suspicious.

## 2023-01-29 ENCOUNTER — Encounter: Payer: Self-pay | Admitting: *Deleted

## 2023-01-30 ENCOUNTER — Telehealth: Payer: Self-pay | Admitting: *Deleted

## 2023-01-30 NOTE — Telephone Encounter (Signed)
Left message on patient's VM to let her know that she only needs mammo/us in December and no MRI.

## 2023-02-07 ENCOUNTER — Encounter: Payer: Self-pay | Admitting: *Deleted

## 2023-03-01 ENCOUNTER — Encounter: Payer: Self-pay | Admitting: *Deleted

## 2023-03-05 ENCOUNTER — Encounter: Payer: Self-pay | Admitting: Hematology and Oncology

## 2023-03-26 ENCOUNTER — Telehealth: Payer: Self-pay

## 2023-03-26 NOTE — Telephone Encounter (Signed)
Called back Mrs. Gulledge to let her know that her 04/02/2023 visit with Dr. Pamelia Hoit will be over the phone at 11am.  He will call her and do the visit over the phone. Lorayne Marek, RN

## 2023-04-02 ENCOUNTER — Inpatient Hospital Stay: Payer: Medicare Other | Attending: Hematology and Oncology | Admitting: Hematology and Oncology

## 2023-04-02 ENCOUNTER — Encounter: Payer: Self-pay | Admitting: *Deleted

## 2023-04-02 DIAGNOSIS — C50412 Malignant neoplasm of upper-outer quadrant of left female breast: Secondary | ICD-10-CM

## 2023-04-02 DIAGNOSIS — Z170 Estrogen receptor positive status [ER+]: Secondary | ICD-10-CM

## 2023-04-02 MED ORDER — ANASTROZOLE 1 MG PO TABS: 1.0000 mg | ORAL_TABLET | Freq: Every day | ORAL | 3 refills | Status: DC

## 2023-04-02 NOTE — Progress Notes (Signed)
 HEMATOLOGY-ONCOLOGY TELEPHONE VISIT PROGRESS NOTE  I connected with our patient on 04/02/23 at 11:00 AM EST by telephone and verified that I am speaking with the correct person using two identifiers.  I discussed the limitations, risks, security and privacy concerns of performing an evaluation and management service by telephone and the availability of in person appointments.  I also discussed with the patient that there may be a patient responsible charge related to this service. The patient expressed understanding and agreed to proceed.   History of Present Illness:    History of Present Illness   The patient, with a history of breast cancer and glaucoma, presents for ongoing management. She recently had a mammogram and was informed that the results were stable, with a small shadow and unchanged lymph node under the arm. She expresses reluctance to undergo further invasive procedures for the lymph node due to her current visual impairment.  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 Her left eye vision is 2300. This has severely impacted her daily activities, preventing her from cooking, opening mail, and writing checks. This will be her 11th surgery in two and a half years.        Oncology History  Malignant neoplasm of upper-outer quadrant of left breast in female, estrogen receptor positive (HCC)  05/17/2022 Initial Diagnosis   Mammogram and ultrasound detected left breast cancer 8 cm size biopsy revealed grade 2 ILC with LCIS, lymph node positive for cancer, ER 100%, PR 100%, Ki67 20%, HER2 1+   05/23/2022 Cancer Staging   Staging form: Breast, AJCC 8th Edition - Clinical: Stage IIA (cT3, cN1, cM0, G2, ER+, PR+, HER2-) - Signed by Odean Potts, MD on 05/23/2022 Histologic grading system: 3 grade system   08/15/2022 Genetic Testing   [facility] Multi-Cancer +RNA Panel.  VUS in NTHL1 at c.68T>C (p.Leu23Pro).  Report date is 08/15/2022.   The Multi-Cancer + RNA Panel offered by [facility] includes sequencing and/or deletion/duplication analysis of the following 70 genes:  AIP*, ALK, APC*, ATM*, AXIN2*, BAP1*, BARD1*, BLM*, BMPR1A*, BRCA1*, BRCA2*, BRIP1*, CDC73*, CDH1*, CDK4, CDKN1B*, CDKN2A, CHEK2*, CTNNA1*, DICER1*, EPCAM (del/dup only), EGFR, FH*, FLCN*, GREM1 (promoter dup only), HOXB13, KIT, LZTR1, MAX*, MBD4, MEN1*, MET, MITF, MLH1*, MSH2*, MSH3*, MSH6*, MUTYH*, NF1*, NF2*, NTHL1*, PALB2*, PDGFRA, PMS2*, POLD1*, POLE*, POT1*, PRKAR1A*, PTCH1*, PTEN*, RAD51C*, RAD51D*, RB1*, RET, SDHA* (sequencing only), SDHAF2*, SDHB*, SDHC*, SDHD*, SMAD4*, SMARCA4*, SMARCB1*, SMARCE1*, STK11*, SUFU*, TMEM127*, TP53*, TSC1*, TSC2*, VHL*. RNA analysis is performed for * genes.     REVIEW OF SYSTEMS:   Constitutional: Denies fevers, chills or abnormal weight loss All other systems were reviewed with the patient and are negative. Observations/Objective:     Assessment Plan:  Malignant neoplasm of upper-outer quadrant of left breast in female, estrogen receptor positive (HCC) 05/17/2022:Mammogram and ultrasound detected left breast cancer 8 cm size biopsy revealed grade 2 ILC with LCIS, lymph node positive for cancer, ER 100%, PR 100%, Ki67 20%, HER2 1+    Recommendations: 1.  Neoadjuvant antiestrogen therapy with anastrozole  x 6 months started 05/26/2022 2. surgery (patient is not very interested in doing surgery but she is willing to consider it after 6 months of neoadjuvant antiestrogen therapy) 3. Adjuvant radiation therapy followed by 3.  Continuation of adjuvant antiestrogen therapy   Given the lobular nature of her breast cancer and her age we decided that chemotherapy would not be in her best interest.  She is in complete  agreement and wants us  to focus on her quality of life.   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  02/28/2023: Mammogram and ultrasound: Previously biopsied mass left breast 11 o'clock position is not seen on today's ultrasound consistent with treatment response. --------------------------------- Assessment and Plan    Breast Cancer Stable findings on recent mammogram with no new concerning symptoms. Axillary lymph node unchanged in size. Plan for bilateral mammogram in March. -Continue current medication regimen. -Refill medication for a year at [facility] [address]. -Plan for follow-up in April after March mammogram.  Glaucoma Elevated intraocular pressure with significant visual impairment. 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 -Continue care with [facility] ophthalmology team.  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 -Continue care with Dr. Willma Pouch at [facility]  Functional Impairment Significant visual impairment leading to difficulty with daily activities such as cooking, reading mail, and writing checks. -Continue care with [facility] ophthalmology team.          I discussed the assessment and treatment plan with the patient. The patient was provided an opportunity to ask questions and all were answered. The patient agreed with the plan and demonstrated an understanding of the instructions. The patient was advised to call back or seek an in-person evaluation if the symptoms worsen or if the condition fails to improve as anticipated.   I provided 20 minutes of non-face-to-face time during this encounter.  This includes time for charting and coordination of care   Naomi MARLA Chad, MD

## 2023-04-02 NOTE — Assessment & Plan Note (Signed)
 05/17/2022:Mammogram and ultrasound detected left breast cancer 8 cm size biopsy revealed grade 2 ILC with LCIS, lymph node positive for cancer, ER 100%, PR 100%, Ki67 20%, HER2 1+    Recommendations: 1.  Neoadjuvant antiestrogen therapy with anastrozole  x 6 months started 05/26/2022 2. surgery (patient is not very interested in doing surgery but she is willing to consider it after 6 months of neoadjuvant antiestrogen therapy) 3. Adjuvant radiation therapy followed by 3.  Continuation of adjuvant antiestrogen therapy   Given the lobular nature of her breast cancer and her age we decided that chemotherapy would not be in her best interest.  She is in complete agreement and wants us  to focus on her quality of life.   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  02/28/2023: Mammogram and ultrasound: Previously biopsied mass left breast 11 o'clock position is not seen on today's ultrasound consistent with treatment response.

## 2023-04-10 ENCOUNTER — Encounter: Payer: Self-pay | Admitting: *Deleted

## 2023-04-11 ENCOUNTER — Telehealth: Payer: Self-pay | Admitting: Hematology and Oncology

## 2023-04-11 NOTE — Telephone Encounter (Signed)
 Called and scheduled patient for f/u visit.

## 2023-06-06 ENCOUNTER — Encounter: Payer: Self-pay | Admitting: Hematology and Oncology

## 2023-06-19 ENCOUNTER — Encounter: Payer: Self-pay | Admitting: *Deleted

## 2023-06-26 ENCOUNTER — Encounter: Payer: Self-pay | Admitting: *Deleted

## 2023-06-26 ENCOUNTER — Inpatient Hospital Stay: Payer: Medicare Other | Attending: Hematology and Oncology | Admitting: Hematology and Oncology

## 2023-06-26 VITALS — BP 162/63 | HR 68 | Temp 97.9°F | Resp 18 | Wt 200.4 lb

## 2023-06-26 DIAGNOSIS — Z170 Estrogen receptor positive status [ER+]: Secondary | ICD-10-CM | POA: Diagnosis not present

## 2023-06-26 DIAGNOSIS — Z79811 Long term (current) use of aromatase inhibitors: Secondary | ICD-10-CM | POA: Insufficient documentation

## 2023-06-26 DIAGNOSIS — H547 Unspecified visual loss: Secondary | ICD-10-CM | POA: Diagnosis not present

## 2023-06-26 DIAGNOSIS — C50412 Malignant neoplasm of upper-outer quadrant of left female breast: Secondary | ICD-10-CM | POA: Insufficient documentation

## 2023-06-26 DIAGNOSIS — H1820 Unspecified corneal edema: Secondary | ICD-10-CM | POA: Insufficient documentation

## 2023-06-26 NOTE — Assessment & Plan Note (Signed)
 05/17/2022:Mammogram and ultrasound detected left breast cancer 8 cm size biopsy revealed grade 2 ILC with LCIS, lymph node positive for cancer, ER 100%, PR 100%, Ki67 20%, HER2 1+    Recommendations: 1.  Neoadjuvant antiestrogen therapy with anastrozole x 6 months started 05/26/2022 2. surgery (patient is not very interested in doing surgery but she is willing to consider it after 6 months of neoadjuvant antiestrogen therapy) 3. Adjuvant radiation therapy followed by 3.  Continuation of adjuvant antiestrogen therapy   Given the lobular nature of her breast cancer and her age we decided that chemotherapy would not be in her best interest.  She is in complete agreement and wants [place] to focus on her quality of life.   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   02/28/2023: Mammogram and ultrasound: Previously biopsied mass left breast 11 o'clock position is not seen on today's ultrasound consistent with treatment response. 3/11/2025[name] mammogram: No suspicious findings in the left breast at the site of previously known invasive lobular cancer suggestive of treatment response.  No change in the left axillary lymph node

## 2023-06-26 NOTE — Progress Notes (Signed)
 Patient Care Team: Chilton Greathouse, MD as PCP - General (Internal Medicine) Pershing Proud, RN as Oncology Nurse Navigator Donnelly Angelica, RN as Oncology Nurse Navigator Emelia Loron, MD as Consulting Physician (General Surgery) Serena Croissant, MD as Consulting Physician (Hematology and Oncology)  DIAGNOSIS:  Encounter Diagnosis  Name Primary?   Malignant neoplasm of upper-outer quadrant of left breast in female, estrogen receptor positive (HCC) Yes    SUMMARY OF ONCOLOGIC HISTORY: Oncology History  Malignant neoplasm of upper-outer quadrant of left breast in female, estrogen receptor positive (HCC)  05/17/2022 Initial Diagnosis   Mammogram and ultrasound detected left breast cancer 8 cm size biopsy revealed grade 2 ILC with LCIS, lymph node positive for cancer, ER 100%, PR 100%, Ki67 20%, HER2 1+   05/23/2022 Cancer Staging   Staging form: Breast, AJCC 8th Edition - Clinical: Stage IIA (cT3, cN1, cM0, G2, ER+, PR+, HER2-) - Signed by Serena Croissant, MD on 05/23/2022 Histologic grading system: 3 grade system   08/15/2022 Genetic Testing   [facility] Multi-Cancer +RNA Panel.  VUS in NTHL1 at c.68T>C (p.Leu23Pro). Report date is 08/15/2022.   The Multi-Cancer + RNA Panel offered by [facility] includes sequencing and/or deletion/duplication analysis of the following 70 genes:  AIP*, ALK, APC*, ATM*, AXIN2*, BAP1*, BARD1*, BLM*, BMPR1A*, BRCA1*, BRCA2*, BRIP1*, CDC73*, CDH1*, CDK4, CDKN1B*, CDKN2A, CHEK2*, CTNNA1*, DICER1*, EPCAM (del/dup only), EGFR, FH*, FLCN*, GREM1 (promoter dup only), HOXB13, KIT, LZTR1, MAX*, MBD4, MEN1*, MET, MITF, MLH1*, MSH2*, MSH3*, MSH6*, MUTYH*, NF1*, NF2*, NTHL1*, PALB2*, PDGFRA, PMS2*, POLD1*, POLE*, POT1*, PRKAR1A*, PTCH1*, PTEN*, RAD51C*, RAD51D*, RB1*, RET, SDHA* (sequencing only), SDHAF2*, SDHB*, SDHC*, SDHD*, SMAD4*, SMARCA4*, SMARCB1*, SMARCE1*, STK11*, SUFU*, TMEM127*, TP53*, TSC1*, TSC2*, VHL*. RNA analysis is performed for * genes.      CHIEF COMPLIANT:   HISTORY OF PRESENT ILLNESS: Discussed the use of AI scribe software for clinical note transcription with the patient, who gave verbal consent to proceed.  History of Present Illness The patient, with a history of breast cancer and an abdominal aneurysm, presents for a follow-up visit. The patient reports that she has not had a chance to discuss her recent mammogram results with her doctor, but she is aware that the results were not concerning. The patient also mentions a lymph node under her arm that has not changed in size, which her doctor has previously expressed concern about.  The patient's abdominal aneurysm has been stable at 3.2 in the distal end for eight years and is beginning to calcify. The patient is scheduled for a full physical examination, including a sonogram of her carotids and aneurysm, at the [facility] in [place].  The patient has also been dealing with significant vision issues. She underwent two cornea transplants, the first of which failed. The patient's vision remains significantly impaired, with the patient describing her vision as blurred and non-functional. The patient's vision issues have significantly impacted her quality of life, requiring her to have someone at home with her at all times and preventing her from working.     ALLERGIES:  is allergic to cortisone, povidone-iodine, povidone iodine, statins, and sulfa antibiotics.  MEDICATIONS:  Current Outpatient Medications  Medication Sig Dispense Refill   anastrozole (ARIMIDEX) 1 MG tablet Take 1 tablet (1 mg total) by mouth daily. 90 tablet 3   B Complex Vitamins (VITAMIN B COMPLEX PO) Take 1 capsule by mouth daily.     Cholecalciferol 50 MCG (2000 UT) CAPS Take by mouth.     Cranberry 400 MG CAPS cranberry 400  mg capsule  Take 1 capsule every day by oral route.     ezetimibe (ZETIA) 10 MG tablet Take 10 mg by mouth daily.     levothyroxine (SYNTHROID) 50 MCG tablet Take 50 mcg  by mouth every morning.     Lutein 20 MG TABS lutein 20 mg tablet  Take 1 tablet every day by oral route.     meloxicam (MOBIC) 7.5 MG tablet meloxicam 7.5 mg tablet     Multiple Vitamins-Minerals (PRESERVISION AREDS 2+MULTI VIT PO) PreserVision AREDS  take 2 tabs daily     mupirocin ointment (BACTROBAN) 2 % APPLY TO AFFECTED AREA 3 TIMES A DAY 22 g 2   prednisoLONE acetate (PRED FORTE) 1 % ophthalmic suspension Place 1 drop into the left eye daily.     Vitamin D, Ergocalciferol, (DRISDOL) 1.25 MG (50000 UNIT) CAPS capsule Take 50,000 Units by mouth once a week.     No current facility-administered medications for this visit.    PHYSICAL EXAMINATION: ECOG PERFORMANCE STATUS: 1 - Symptomatic but completely ambulatory  There were no vitals filed for this visit. There were no vitals filed for this visit.  Physical Exam   (exam performed in the presence of a chaperone)  LABORATORY DATA:  I have reviewed the data as listed    04/14/2007    3:06 AM  CMP  Glucose 137   BUN 11   Creatinine 0.79   Sodium 140   Potassium 4.3   Chloride 105   CO2 24   Calcium 9.3     Lab Results  Component Value Date   HGB 12.7 04/14/2007   HCT 36.9 04/14/2007    ASSESSMENT & PLAN:  Malignant neoplasm of upper-outer quadrant of left breast in female, estrogen receptor positive (HCC) 05/17/2022:Mammogram and ultrasound detected left breast cancer 8 cm size biopsy revealed grade 2 ILC with LCIS, lymph node positive for cancer, ER 100%, PR 100%, Ki67 20%, HER2 1+    Recommendations: 1.  Neoadjuvant antiestrogen therapy with anastrozole x 6 months started 05/26/2022 2. patient decided not to pursue surgery   She wants [place] to focus on her quality of life.  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   02/28/2023: Mammogram and ultrasound: Previously biopsied mass left breast 11 o'clock position is not seen  on today's ultrasound consistent with treatment response. 3/11/2025[facility] mammogram: No suspicious findings in the left breast at the site of previously known invasive lobular cancer suggestive of treatment response.  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  This is affecting her quality of life.  Recommendation: Repeat another left breast mammogram in 6 months and telephone visit 2 weeks after that. She will continue with anastrozole therapy ------------------------------------- Assessment and Plan Assessment & Plan Malignant neoplasm of upper-outer quadrant of left breast, estrogen receptor positive Recent mammogram shows no suspicious findings at the site of the previously known breast cancer, indicating a treatment response. No significant change in the previously biopsied left arm lymph node. Right breast appears normal. Currently on Anastrozole, which is well-tolerated and effective in controlling the condition. She performs regular self-breast exams and feels well overall. 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 The second transplant was performed in February, but vision remains significantly impaired, with the left eye being non-functional. Experiences severe visual impairment, affecting her ability to perform daily activities independently. A strip of inflammation over the left retina may warrant a cortisone injection if the condition persists. She is adapting to the situation as best as possible. 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 She is aware of the limitations and is adapting to the situation as best as possible.  Follow-up Will have a full physical examination in [place], including sonograms of the carotids and abdominal aneurysm. Will also have a follow-up mammogram in six months. - Schedule full physical examination in [place], including sonograms of carotids and abdominal aneurysm - Order follow-up mammogram in six months      No orders of the defined types were placed in this encounter.  The patient has a good understanding of the overall plan. she agrees with it. she will call with any problems that may develop before the next visit here. Total time spent: 30 mins including face to face time and time spent for planning, charting and co-ordination of care   Tamsen Meek, MD 06/26/23

## 2023-09-21 ENCOUNTER — Telehealth: Payer: Self-pay

## 2023-09-21 NOTE — Telephone Encounter (Signed)
 Pt called and states she has started levofloxacin and asks if levofloxacin and anastrozole  had any interactions. Advised pt there are no interactions which should cause arrhythmias. She states sher HR will go tp and back down quickly and this has happened about 4 times. She was not able to give numbers.  Pt was advised to be evaluated in ED and educated on risk for possible a-fib. Pt states she does not have a cardiac hx and declines ED but if she has an emergency over the weekend she will go, otherwise will call her PCP Monday.

## 2023-10-12 ENCOUNTER — Encounter: Payer: Self-pay | Admitting: Advanced Practice Midwife

## 2023-11-23 ENCOUNTER — Encounter: Payer: Self-pay | Admitting: Hematology and Oncology

## 2023-12-31 ENCOUNTER — Inpatient Hospital Stay: Attending: Hematology and Oncology | Admitting: Hematology and Oncology

## 2023-12-31 VITALS — BP 122/78 | HR 52 | Temp 97.6°F | Resp 16 | Ht 65.5 in | Wt 194.9 lb

## 2023-12-31 DIAGNOSIS — Z17411 Hormone receptor positive with human epidermal growth factor receptor 2 negative status: Secondary | ICD-10-CM | POA: Insufficient documentation

## 2023-12-31 DIAGNOSIS — Z170 Estrogen receptor positive status [ER+]: Secondary | ICD-10-CM

## 2023-12-31 DIAGNOSIS — Z79811 Long term (current) use of aromatase inhibitors: Secondary | ICD-10-CM | POA: Diagnosis not present

## 2023-12-31 DIAGNOSIS — C50412 Malignant neoplasm of upper-outer quadrant of left female breast: Secondary | ICD-10-CM | POA: Insufficient documentation

## 2023-12-31 NOTE — Progress Notes (Signed)
 Patient Care Team: Avva, Ravisankar, MD as PCP - General (Internal Medicine) Tyree Nanetta SAILOR, RN as Oncology Nurse Navigator Ebbie Cough, MD as Consulting Physician (General Surgery) Odean Potts, MD as Consulting Physician (Hematology and Oncology)  DIAGNOSIS:  Encounter Diagnosis  Name Primary?   Malignant neoplasm of upper-outer quadrant of left breast in female, estrogen receptor positive (HCC) Yes    SUMMARY OF ONCOLOGIC HISTORY: Oncology History  Malignant neoplasm of upper-outer quadrant of left breast in female, estrogen receptor positive (HCC)  05/17/2022 Initial Diagnosis   Mammogram and ultrasound detected left breast cancer 8 cm size biopsy revealed grade 2 ILC with LCIS, lymph node positive for cancer, ER 100%, PR 100%, Ki67 20%, HER2 1+   05/23/2022 Cancer Staging   Staging form: Breast, AJCC 8th Edition - Clinical: Stage IIA (cT3, cN1, cM0, G2, ER+, PR+, HER2-) - Signed by Odean Potts, MD on 05/23/2022 Histologic grading system: 3 grade system   08/15/2022 Genetic Testing   [facility] Multi-Cancer +RNA Panel.  VUS in NTHL1 at c.68T>C (p.Leu23Pro). Report date is 08/15/2022.   The Multi-Cancer + RNA Panel offered by [facility] includes sequencing and/or deletion/duplication analysis of the following 70 genes:  AIP*, ALK, APC*, ATM*, AXIN2*, BAP1*, BARD1*, BLM*, BMPR1A*, BRCA1*, BRCA2*, BRIP1*, CDC73*, CDH1*, CDK4, CDKN1B*, CDKN2A, CHEK2*, CTNNA1*, DICER1*, EPCAM (del/dup only), EGFR, FH*, FLCN*, GREM1 (promoter dup only), HOXB13, KIT, LZTR1, MAX*, MBD4, MEN1*, MET, MITF, MLH1*, MSH2*, MSH3*, MSH6*, MUTYH*, NF1*, NF2*, NTHL1*, PALB2*, PDGFRA, PMS2*, POLD1*, POLE*, POT1*, PRKAR1A*, PTCH1*, PTEN*, RAD51C*, RAD51D*, RB1*, RET, SDHA* (sequencing only), SDHAF2*, SDHB*, SDHC*, SDHD*, SMAD4*, SMARCA4*, SMARCB1*, SMARCE1*, STK11*, SUFU*, TMEM127*, TP53*, TSC1*, TSC2*, VHL*. RNA analysis is performed for * genes.     CHIEF COMPLIANT: Follow-up on antiestrogen  therapy  HISTORY OF PRESENT ILLNESS:   History of Present Illness April Klein is an 83 year old female who presents for follow-up after experiencing pneumonia and medication-related dizziness.  In July, she developed pneumonia in her right lung, experiencing severe right-sided pain and respiratory distress, which confined her to bed for two weeks. She was treated with a broad-spectrum antibiotic, likely Levofloxacin, and began to feel better around the sixth or seventh day of treatment. A follow-up chest X-ray confirmed resolution of the pneumonia.  During antibiotic treatment, she experienced dizziness and balance issues, leading her to discontinue the medication after three days. She takes Synthroid and cranberry supplements, with no other medications contributing to these symptoms.  She has a history of breast cancer and underwent a mammogram on August 25th, showing no evidence of breast cancer and a reduction in the size of a previously biopsied lymph node under her left arm. She has been off her breast cancer medication since mid-July due to side effects experienced with the antibiotic.     ALLERGIES:  is allergic to cortisone, povidone-iodine, povidone iodine, statins, and sulfa antibiotics.  MEDICATIONS:  Current Outpatient Medications  Medication Sig Dispense Refill   anastrozole  (ARIMIDEX ) 1 MG tablet Take 1 tablet (1 mg total) by mouth daily. 90 tablet 3   B Complex Vitamins (VITAMIN B COMPLEX PO) Take 1 capsule by mouth daily.     Cholecalciferol 50 MCG (2000 UT) CAPS Take by mouth.     Cranberry 400 MG CAPS cranberry 400 mg capsule  Take 1 capsule every day by oral route.     levothyroxine (SYNTHROID) 50 MCG tablet Take 50 mcg by mouth every morning.     Lutein 20 MG TABS lutein 20 mg tablet  Take  1 tablet every day by oral route.     meloxicam (MOBIC) 7.5 MG tablet meloxicam 7.5 mg tablet     Multiple Vitamins-Minerals (PRESERVISION AREDS 2+MULTI VIT PO) PreserVision  AREDS  take 2 tabs daily     mupirocin  ointment (BACTROBAN ) 2 % APPLY TO AFFECTED AREA 3 TIMES A DAY 22 g 2   prednisoLONE acetate (PRED FORTE) 1 % ophthalmic suspension Place 1 drop into the left eye daily.     Vitamin D, Ergocalciferol, (DRISDOL) 1.25 MG (50000 UNIT) CAPS capsule Take 50,000 Units by mouth once a week.     No current facility-administered medications for this visit.    PHYSICAL EXAMINATION: ECOG PERFORMANCE STATUS: 1 - Symptomatic but completely ambulatory  Vitals:   12/31/23 1104  BP: 122/78  Pulse: (!) 52  Resp: 16  Temp: 97.6 F (36.4 C)  SpO2: 100%   Filed Weights   12/31/23 1104  Weight: 194 lb 14.4 oz (88.4 kg)      LABORATORY DATA:  I have reviewed the data as listed    04/14/2007    3:06 AM  CMP  Glucose 137   BUN 11   Creatinine 0.79   Sodium 140   Potassium 4.3   Chloride 105   CO2 24   Calcium 9.3     Lab Results  Component Value Date   HGB 12.7 04/14/2007   HCT 36.9 04/14/2007    ASSESSMENT & PLAN:  Malignant neoplasm of upper-outer quadrant of left breast in female, estrogen receptor positive (HCC) 05/17/2022:Mammogram and ultrasound detected left breast cancer 8 cm size biopsy revealed grade 2 ILC with LCIS, lymph node positive for cancer, ER 100%, PR 100%, Ki67 20%, HER2 1+    Recommendations: 1.  Neoadjuvant antiestrogen therapy with anastrozole  x 6 months started 05/26/2022 2. patient decided not to pursue surgery   She wants us  to focus on her quality of life.  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   02/28/2023: Mammogram and ultrasound: Previously biopsied mass left breast 11 o'clock position is not seen on today's ultrasound consistent with treatment response. 3/11/2025[facility] mammogram: No suspicious findings in the left breast at the site of previously known invasive lobular cancer suggestive of treatment response.  No  change in the left axillary lymph node. 11/19/2023: No suspicious findings in the left breast at the site of invasive lobular carcinoma.  Consistent with treatment response.   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  This is affecting her quality of life. Recent pneumonia: Treated with broad-spectrum antibiotics.  She held anastrozole  for 3 months.  Resuming today. Continue every 47-month mammograms and follow-up with me in 1 year  ------------------------------------- Assessment and Plan Assessment & Plan Estrogen receptor positive malignant neoplasm of upper-outer quadrant of left breast, status post treatment Mammogram showed no suspicious findings, indicating positive treatment response. Left axillary lymph node reduced in size. Anastrozole  was stopped due to dizziness and balance issues from drug interaction, but she is willing to resume. - Resume Anastrozole  1 MG oral daily.      No orders of the defined types were placed in this encounter.  The patient has a good understanding of the overall plan. she agrees with it. she will call with any problems that may develop before the next visit here. Total time spent: 30 mins including face to face time and time spent for  planning, charting and co-ordination of care   Naomi MARLA Chad, MD 12/31/23

## 2023-12-31 NOTE — Assessment & Plan Note (Signed)
 05/17/2022:Mammogram and ultrasound detected left breast cancer 8 cm size biopsy revealed grade 2 ILC with LCIS, lymph node positive for cancer, ER 100%, PR 100%, Ki67 20%, HER2 1+    Recommendations: 1.  Neoadjuvant antiestrogen therapy with anastrozole  x 6 months started 05/26/2022 2. patient decided not to pursue surgery   She wants us  to focus on her quality of life.  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   02/28/2023: Mammogram and ultrasound: Previously biopsied mass left breast 11 o'clock position is not seen on today's ultrasound consistent with treatment response. 3/11/2025[facility] mammogram: No suspicious findings in the left breast at the site of previously known invasive lobular cancer suggestive of treatment response.  No change in the left axillary lymph node. 11/19/2023: No suspicious findings in the left breast at the site of invasive lobular carcinoma.  Consistent with treatment response.   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  This is affecting her quality of life.  Continue every 37-month mammograms

## 2024-04-14 ENCOUNTER — Other Ambulatory Visit: Payer: Self-pay | Admitting: Hematology and Oncology

## 2024-12-30 ENCOUNTER — Ambulatory Visit: Admitting: Hematology and Oncology
